# Patient Record
Sex: Female | Born: 2000 | Race: White | Hispanic: No | Marital: Single | Smoking: Never smoker
Health system: Southern US, Community
[De-identification: ages and names within clinical notes are randomized; demographics above are authoritative.]

## PROBLEM LIST (undated history)

## (undated) ENCOUNTER — Ambulatory Visit (HOSPITAL_COMMUNITY): Source: Home / Self Care

## (undated) DIAGNOSIS — R480 Dyslexia and alexia: Secondary | ICD-10-CM

## (undated) DIAGNOSIS — J45909 Unspecified asthma, uncomplicated: Secondary | ICD-10-CM

## (undated) DIAGNOSIS — F909 Attention-deficit hyperactivity disorder, unspecified type: Secondary | ICD-10-CM

## (undated) DIAGNOSIS — F39 Unspecified mood [affective] disorder: Secondary | ICD-10-CM

## (undated) HISTORY — DX: Dyslexia and alexia: R48.0

## (undated) HISTORY — PX: TYMPANOSTOMY TUBE PLACEMENT: SHX32

## (undated) HISTORY — PX: TONSILLECTOMY: SUR1361

## (undated) HISTORY — PX: ADENOIDECTOMY: SUR15

## (undated) HISTORY — PX: OTHER SURGICAL HISTORY: SHX169

## (undated) HISTORY — DX: Unspecified mood (affective) disorder: F39

---
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
## 2018-01-16 DIAGNOSIS — F900 Attention-deficit hyperactivity disorder, predominantly inattentive type: Secondary | ICD-10-CM | POA: Diagnosis not present

## 2018-01-16 DIAGNOSIS — Z79899 Other long term (current) drug therapy: Secondary | ICD-10-CM | POA: Diagnosis not present

## 2018-01-16 DIAGNOSIS — F330 Major depressive disorder, recurrent, mild: Secondary | ICD-10-CM | POA: Diagnosis not present

## 2018-01-16 DIAGNOSIS — Z6832 Body mass index (BMI) 32.0-32.9, adult: Secondary | ICD-10-CM | POA: Diagnosis not present

## 2018-01-16 DIAGNOSIS — F419 Anxiety disorder, unspecified: Secondary | ICD-10-CM | POA: Diagnosis not present

## 2018-02-11 ENCOUNTER — Other Ambulatory Visit: Payer: Self-pay | Admitting: Pediatrics

## 2018-03-06 DIAGNOSIS — F411 Generalized anxiety disorder: Secondary | ICD-10-CM | POA: Diagnosis not present

## 2018-03-06 DIAGNOSIS — F419 Anxiety disorder, unspecified: Secondary | ICD-10-CM | POA: Diagnosis not present

## 2018-03-06 DIAGNOSIS — Z79899 Other long term (current) drug therapy: Secondary | ICD-10-CM | POA: Diagnosis not present

## 2018-03-06 DIAGNOSIS — Z6832 Body mass index (BMI) 32.0-32.9, adult: Secondary | ICD-10-CM | POA: Diagnosis not present

## 2018-03-06 DIAGNOSIS — F900 Attention-deficit hyperactivity disorder, predominantly inattentive type: Secondary | ICD-10-CM | POA: Diagnosis not present

## 2018-03-06 DIAGNOSIS — F330 Major depressive disorder, recurrent, mild: Secondary | ICD-10-CM | POA: Diagnosis not present

## 2018-03-13 DIAGNOSIS — Z6832 Body mass index (BMI) 32.0-32.9, adult: Secondary | ICD-10-CM | POA: Diagnosis not present

## 2018-03-13 DIAGNOSIS — F419 Anxiety disorder, unspecified: Secondary | ICD-10-CM | POA: Diagnosis not present

## 2018-03-13 DIAGNOSIS — F330 Major depressive disorder, recurrent, mild: Secondary | ICD-10-CM | POA: Diagnosis not present

## 2018-03-13 DIAGNOSIS — Z79899 Other long term (current) drug therapy: Secondary | ICD-10-CM | POA: Diagnosis not present

## 2018-03-13 DIAGNOSIS — F900 Attention-deficit hyperactivity disorder, predominantly inattentive type: Secondary | ICD-10-CM | POA: Diagnosis not present

## 2018-04-05 ENCOUNTER — Other Ambulatory Visit: Payer: Self-pay | Admitting: Pediatrics

## 2018-04-05 DIAGNOSIS — J3089 Other allergic rhinitis: Secondary | ICD-10-CM

## 2018-04-06 NOTE — Telephone Encounter (Signed)
COVID pandemic request for allergy med refill  Last seen 08/2017  For allergic rhinitis

## 2018-04-24 DIAGNOSIS — F900 Attention-deficit hyperactivity disorder, predominantly inattentive type: Secondary | ICD-10-CM | POA: Diagnosis not present

## 2018-04-24 DIAGNOSIS — F330 Major depressive disorder, recurrent, mild: Secondary | ICD-10-CM | POA: Diagnosis not present

## 2018-04-24 DIAGNOSIS — F419 Anxiety disorder, unspecified: Secondary | ICD-10-CM | POA: Diagnosis not present

## 2018-04-24 DIAGNOSIS — Z6832 Body mass index (BMI) 32.0-32.9, adult: Secondary | ICD-10-CM | POA: Diagnosis not present

## 2018-06-12 DIAGNOSIS — F330 Major depressive disorder, recurrent, mild: Secondary | ICD-10-CM | POA: Diagnosis not present

## 2018-06-12 DIAGNOSIS — F900 Attention-deficit hyperactivity disorder, predominantly inattentive type: Secondary | ICD-10-CM | POA: Diagnosis not present

## 2018-07-27 ENCOUNTER — Other Ambulatory Visit: Payer: Self-pay | Admitting: Pediatrics

## 2018-07-27 DIAGNOSIS — J3089 Other allergic rhinitis: Secondary | ICD-10-CM

## 2018-09-20 ENCOUNTER — Other Ambulatory Visit: Payer: Self-pay | Admitting: Pediatrics

## 2018-09-20 DIAGNOSIS — J3089 Other allergic rhinitis: Secondary | ICD-10-CM

## 2018-09-25 DIAGNOSIS — M7521 Bicipital tendinitis, right shoulder: Secondary | ICD-10-CM | POA: Diagnosis not present

## 2018-10-14 DIAGNOSIS — H1013 Acute atopic conjunctivitis, bilateral: Secondary | ICD-10-CM | POA: Diagnosis not present

## 2019-01-17 DIAGNOSIS — H1013 Acute atopic conjunctivitis, bilateral: Secondary | ICD-10-CM | POA: Diagnosis not present

## 2019-02-13 DIAGNOSIS — G44209 Tension-type headache, unspecified, not intractable: Secondary | ICD-10-CM | POA: Diagnosis not present

## 2019-02-13 DIAGNOSIS — H40033 Anatomical narrow angle, bilateral: Secondary | ICD-10-CM | POA: Diagnosis not present

## 2019-02-21 DIAGNOSIS — H5213 Myopia, bilateral: Secondary | ICD-10-CM | POA: Diagnosis not present

## 2019-07-01 DIAGNOSIS — H5213 Myopia, bilateral: Secondary | ICD-10-CM | POA: Diagnosis not present

## 2019-07-01 DIAGNOSIS — H1013 Acute atopic conjunctivitis, bilateral: Secondary | ICD-10-CM | POA: Diagnosis not present

## 2019-07-23 DIAGNOSIS — Z79899 Other long term (current) drug therapy: Secondary | ICD-10-CM | POA: Diagnosis not present

## 2019-07-23 DIAGNOSIS — F330 Major depressive disorder, recurrent, mild: Secondary | ICD-10-CM | POA: Diagnosis not present

## 2019-08-06 DIAGNOSIS — F330 Major depressive disorder, recurrent, mild: Secondary | ICD-10-CM | POA: Diagnosis not present

## 2019-08-20 DIAGNOSIS — F330 Major depressive disorder, recurrent, mild: Secondary | ICD-10-CM | POA: Diagnosis not present

## 2019-09-30 DIAGNOSIS — F330 Major depressive disorder, recurrent, mild: Secondary | ICD-10-CM | POA: Diagnosis not present

## 2019-11-12 DIAGNOSIS — F330 Major depressive disorder, recurrent, mild: Secondary | ICD-10-CM | POA: Diagnosis not present

## 2019-11-12 DIAGNOSIS — F062 Psychotic disorder with delusions due to known physiological condition: Secondary | ICD-10-CM | POA: Diagnosis not present

## 2019-12-10 DIAGNOSIS — F330 Major depressive disorder, recurrent, mild: Secondary | ICD-10-CM | POA: Diagnosis not present

## 2019-12-10 DIAGNOSIS — F4310 Post-traumatic stress disorder, unspecified: Secondary | ICD-10-CM | POA: Diagnosis not present

## 2020-01-26 ENCOUNTER — Other Ambulatory Visit: Payer: Self-pay

## 2020-01-26 ENCOUNTER — Ambulatory Visit (HOSPITAL_COMMUNITY)
Admission: EM | Admit: 2020-01-26 | Discharge: 2020-01-26 | Disposition: A | Discharge status: Home / Self Care | Payer: Medicaid Other | Attending: Emergency Medicine | Admitting: Emergency Medicine

## 2020-01-26 ENCOUNTER — Encounter (HOSPITAL_COMMUNITY): Payer: Self-pay

## 2020-01-26 ENCOUNTER — Ambulatory Visit (INDEPENDENT_AMBULATORY_CARE_PROVIDER_SITE_OTHER): Payer: Medicaid Other

## 2020-01-26 DIAGNOSIS — M25571 Pain in right ankle and joints of right foot: Secondary | ICD-10-CM | POA: Diagnosis not present

## 2020-01-26 DIAGNOSIS — S93401A Sprain of unspecified ligament of right ankle, initial encounter: Secondary | ICD-10-CM

## 2020-01-26 DIAGNOSIS — W19XXXA Unspecified fall, initial encounter: Secondary | ICD-10-CM

## 2020-01-26 MED ORDER — IBUPROFEN 600 MG PO TABS: 600.0000 mg | ORAL_TABLET | Freq: Four times a day (QID) | ORAL | 0 refills | Status: DC | PRN

## 2020-01-26 NOTE — ED Triage Notes (Signed)
Pt presents with right ankle pain after a few falls last week outside while walking her dog; pt ambulates well.

## 2020-01-26 NOTE — ED Provider Notes (Signed)
HPI  SUBJECTIVE:  Tiffany [name] is a 20 y.o. female who presents with right ankle pain and "Achilles popping" after stepping in a snow-covered hole twice 1 week ago.  She states that she hyperflexed her ankle the first time and then inverted her ankle the second time.  She reports intermittent numbness and tingling on the top of her foot.  No swelling, bruising, erythema.  She can move her ankle through full range of motion, but states that it hurts.  She was ambulatory immediately after.  She denies ankle weakness.  She denies injury to the knee or foot.  Symptoms are worse with walking, no alleviating factors.  She has not tried anything for this.  Past medical history negative for right ankle injury, diabetes, hypertension.  LMP: 1/1.  Denies possibility being pregnant.  PMD: None.    Past Medical History:  Diagnosis Date  . ADHD (attention deficit hyperactivity disorder) 2004   Took Adderall for several years, then Dexedrine, now Strattera  . Asthma "since infancy"   triggers more in winter, has had bronchitis/pneumonia  . Dyslexia   . Mood disorder (HCC) 2013    Past Surgical History:  Procedure Laterality Date  . ADENOIDECTOMY    . OTHER SURGICAL HISTORY     tubes in the ears  . TONSILLECTOMY    . TYMPANOSTOMY TUBE PLACEMENT      Family History  Problem Relation Age of Onset  . Learning disabilities Mother   . Mental illness Mother   . Drug abuse Mother   . Learning disabilities Brother   . Diabetes Maternal Uncle   . Heart disease Maternal Grandmother   . Hypertension Maternal Grandmother   . Learning disabilities Maternal Grandmother   . Depression Maternal Grandmother   . Anxiety disorder Maternal Grandmother   . Learning disabilities Maternal Grandfather   . Heart murmur Maternal Grandfather   . Alcohol abuse Neg Hx   . Clotting disorder Neg Hx   . Mental illness Father   . Heart disease Father     Social History   Tobacco Use  . Smoking status: Never  Smoker  . Tobacco comment: mom quit!    No current facility-administered medications for this encounter.  Current Outpatient Medications:  .  ibuprofen (ADVIL) 600 MG tablet, Take 1 tablet (600 mg total) by mouth every 6 (six) hours as needed., Disp: 30 tablet, Rfl: 0 .  ADDERALL XR 25 MG 24 hr capsule, Take by mouth daily., Disp: , Rfl: 0 .  albuterol (PROAIR HFA) 108 (90 Base) MCG/ACT inhaler, INHALE 2 PUFFS EVERY 4 HOURS AS NEEDED FOR WHEEZING/SHORTNESS OF BREATH/COUGH (Patient not taking: Reported on 09/01/2017), Disp: 1 Inhaler, Rfl: 0 .  cetirizine (ZYRTEC) 10 MG tablet, TAKE 1 TABLET(10 MG) BY MOUTH DAILY, Disp: 30 tablet, Rfl: 5 .  escitalopram (LEXAPRO) 10 MG tablet, Take 10 mg by mouth daily. Reported on 02/26/2015, Disp: , Rfl:  .  fluticasone (FLONASE) 50 MCG/ACT nasal spray, Place 2 sprays into both nostrils daily., Disp: 16 g, Rfl: 5 .  montelukast (SINGULAIR) 5 MG chewable tablet, Chew 1 tablet (5 mg total) by mouth every evening., Disp: 30 tablet, Rfl: 11 .  OXcarbazepine (TRILEPTAL) 150 MG tablet, Take 75 mg by mouth 2 (two) times daily., Disp: , Rfl:  .  traZODone (DESYREL) 100 MG tablet, Take 100 mg by mouth at bedtime., Disp: , Rfl: 1 .  valACYclovir (VALTREX) 1000 MG tablet, Take 2 tablets (2,000 mg total) by mouth 2 (two) times  daily. (Patient not taking: Reported on 09/01/2017), Disp: 4 tablet, Rfl: 1 .  XULANE 150-35 MCG/24HR transdermal patch, PLACE 1 PATCH ONTO THE SKIN ONCE A WEEK. USE FOR 3 WEEKS THEN LEAVE OFF FOR 1 WEEK THEN RESTART, Disp: 3 patch, Rfl: 0 .  XULANE 150-35 MCG/24HR transdermal patch, PLACE 1 PATCH ONTO THE SKIN ONCE A WEEK. USE FOR 3 WEEKS THEN LEAVE OFF FOR 1 WEEK THEN RESTART, Disp: 3 patch, Rfl: 4 .  XULANE 150-35 MCG/24HR transdermal patch, PLACE 1 PATCH ONTO THE SKIN ONCE A WEEK. USE FOR 3 WEEKS THEN LEAVE OFF FOR 1 WEEK THEN RESTART, Disp: 3 patch, Rfl: 0 .  XULANE 150-35 MCG/24HR transdermal patch, PLACE 1 PATCH ONTO THE SKIN ONCE A WEEK FOR THREE  WEEKS, THEN LEAVE OFF FOR 1 WEEK THEN RESTART, Disp: 3 patch, Rfl: 11  Allergies  Allergen Reactions  . Augmentin [Amoxicillin-Pot Clavulanate] Hives and Other (See Comments)          ROS  As noted in HPI.   Physical Exam  BP 111/61 (BP Location: Right Arm)   Pulse 82   Temp 99.1 F (37.3 C) (Oral)   Resp 18   LMP 01/04/2020   SpO2 100%   Constitutional: Well developed, well nourished, no acute distress Eyes:  EOMI, conjunctiva normal bilaterally HENT: Normocephalic, atraumatic,mucus membranes moist Respiratory: Normal inspiratory effort Cardiovascular: Normal rate GI: nondistended skin: No rash, skin intact Musculoskeletal:  R  Ankle Proximal fibula NT, Distal fibula NT , Medial malleolus tender,  Deltoid ligaments medially tender,  ATFL NT , calcaneofibular ligament NT, posterior tablofibular ligament tender,  Achilles tender, no soft tissue defects, calcaneus NT,  Proximal 5th metatarsal mildly tender but no ecchymosis, swelling in this area. Midfoot NT, distal NVI with baseline sensation / motor to foot with DP 2+. Pain  with dorsiflexion/plantar flexion. Pain with inversion/eversion. - bruising. - squeeze test .  Ant drawer test stable. Pt able to bear weight in dept.  Neurologic: Alert & oriented x 3, no focal neuro deficits Psychiatric: Speech and behavior appropriate   ED Course   Medications - No data to display  Orders Placed This Encounter  Procedures  . DG Ankle Complete Right    Standing Status:   Standing    Number of Occurrences:   1    Order Specific Question:   Reason for Exam (SYMPTOM  OR DIAGNOSIS REQUIRED)    Answer:   fall r/o fx  . Apply ASO ankle    Standing Status:   Standing    Number of Occurrences:   1    Order Specific Question:   Laterality    Answer:   Right    No results found for this or any previous visit (from the past 24 hour(s)). DG Ankle Complete Right  Result Date: 01/26/2020 CLINICAL DATA:  Right ankle pain after multiple  falls. EXAM: RIGHT ANKLE - COMPLETE 3+ VIEW COMPARISON:  None. FINDINGS: No acute fracture or dislocation. Base of fifth metatarsal and talar dome intact. IMPRESSION: No acute osseous abnormality. Electronically Signed   By: Jeronimo Greaves M.D.   On: 01/26/2020 18:29    ED Clinical Impression  1. Sprain of right ankle, unspecified ligament, initial encounter      ED Assessment/Plan  Reviewed imaging independently.  No fracture, no dislocation.  Base of fifth metatarsal intact.  See radiology report for full details.  Patient with sprain of multiple ligaments right ankle.  There does not appear to be any evidence of  an Achilles tendon tear.  Films negative for fracture of the ankle or fifth metatarsal. will place in an ASO, Tylenol/ibuprofen, ice, refer to [facility] sports medicine for further evaluation and possible physical therapy.  Discussed imaging, MDM, treatment plan, and plan for follow-up with patient. . patient agrees with plan.   Meds ordered this encounter  Medications  . ibuprofen (ADVIL) 600 MG tablet    Sig: Take 1 tablet (600 mg total) by mouth every 6 (six) hours as needed.    Dispense:  30 tablet    Refill:  0    *This clinic note was created using Scientist, clinical (histocompatibility and immunogenetics). Therefore, there may be occasional mistakes despite careful proofreading.   ?    Domenick Gong, MD 01/27/20 [phone]

## 2020-01-26 NOTE — Discharge Instructions (Addendum)
Wear the ASO for the next several weeks as needed for comfort.  Ice for 15 minutes at a time, elevate above your heart is much as possible.  Take 600 mg of ibuprofen combined with 1000 mg of Tylenol 3-4 times a day as needed for pain.  Follow-up with [facility] sports [facility] as soon as she can.

## 2020-03-03 DIAGNOSIS — F330 Major depressive disorder, recurrent, mild: Secondary | ICD-10-CM | POA: Diagnosis not present

## 2020-06-02 DIAGNOSIS — F330 Major depressive disorder, recurrent, mild: Secondary | ICD-10-CM | POA: Diagnosis not present

## 2020-08-16 ENCOUNTER — Encounter (HOSPITAL_COMMUNITY): Payer: Self-pay

## 2020-08-16 ENCOUNTER — Ambulatory Visit (HOSPITAL_COMMUNITY)
Admission: EM | Admit: 2020-08-16 | Discharge: 2020-08-16 | Disposition: A | Discharge status: Home / Self Care | Payer: Medicaid Other | Attending: Student | Admitting: Student

## 2020-08-16 DIAGNOSIS — L719 Rosacea, unspecified: Secondary | ICD-10-CM | POA: Diagnosis not present

## 2020-08-16 MED ORDER — METRONIDAZOLE 1 % EX GEL: Freq: Every day | CUTANEOUS | 1 refills | Status: DC

## 2020-08-16 NOTE — ED Provider Notes (Signed)
[facility]    CSN: [id number] Arrival date & time: 08/16/20  1154      History   Chief Complaint Chief Complaint  Patient presents with   Rash    HPI Tiffany [name] is a 20 y.o. female presenting with facial rash for about 1 month.  Medical history asthma, ADHD.  States that the rash has been present for the entire month, but with some exacerbations and improvements.  The rash is sometimes on her eyelids, but not currently. Describes as occasionally itchy. Has tried tea tree oil, other OTC products without improvement. Denies changes in routine or new products/fragrances. Unable to identify triggers for making rash worse.   HPI  Past Medical History:  Diagnosis Date   ADHD (attention deficit hyperactivity disorder) 2004   Took Adderall for several years, then Dexedrine, now Strattera   Asthma "since infancy"   triggers more in winter, has had bronchitis/pneumonia   Dyslexia    Mood disorder (HCC) 2013    Patient Active Problem List   Diagnosis Date Noted   Bilateral hip pain 04/20/2015   Adjustment disorder with mixed anxiety and depressed mood 04/14/2014   History of self-harm 04/14/2014   Dry eyes 01/08/2014   Dyslexia 12/06/2013   Dysmenorrhea in adolescent 11/12/2013   Surveillance of previously prescribed contraceptive method 11/12/2013   ADHD (attention deficit hyperactivity disorder) 09/04/2013   Asthma, chronic 09/04/2013    Past Surgical History:  Procedure Laterality Date   ADENOIDECTOMY     OTHER SURGICAL HISTORY     tubes in the ears   TONSILLECTOMY     TYMPANOSTOMY TUBE PLACEMENT      OB History   No obstetric history on file.      Home Medications    Prior to Admission medications   Medication Sig Start Date End Date Taking? Authorizing Provider  metroNIDAZOLE (METROGEL) 1 % gel Apply topically daily. Apply to cheeks 2x daily while symptoms persist 08/16/20  Yes Rhys Martini, PA-C  ADDERALL XR 25 MG 24 hr capsule Take by  mouth daily. 02/13/15   [provider]  albuterol (PROAIR HFA) 108 (90 Base) MCG/ACT inhaler INHALE 2 PUFFS EVERY 4 HOURS AS NEEDED FOR WHEEZING/SHORTNESS OF BREATH/COUGH Patient not taking: Reported on 09/01/2017 02/26/15   Gardenia Phlegm, MD  cetirizine (ZYRTEC) 10 MG tablet TAKE 1 TABLET(10 MG) BY MOUTH DAILY 04/05/18   Prose, [place] Bing, MD  escitalopram (LEXAPRO) 10 MG tablet Take 10 mg by mouth daily. Reported on 02/26/2015 02/13/14   [provider]  fluticasone (FLONASE) 50 MCG/ACT nasal spray Place 2 sprays into both nostrils daily. 09/01/17   Theadore Nan, MD  ibuprofen (ADVIL) 600 MG tablet Take 1 tablet (600 mg total) by mouth every 6 (six) hours as needed. 01/26/20   Domenick Gong, MD  montelukast (SINGULAIR) 5 MG chewable tablet Chew 1 tablet (5 mg total) by mouth every evening. 09/01/17   Theadore Nan, MD  OXcarbazepine (TRILEPTAL) 150 MG tablet Take 75 mg by mouth 2 (two) times daily.    [provider]  traZODone (DESYREL) 100 MG tablet Take 100 mg by mouth at bedtime. 03/12/15   [provider]  valACYclovir (VALTREX) 1000 MG tablet Take 2 tablets (2,000 mg total) by mouth 2 (two) times daily. Patient not taking: Reported on 09/01/2017 04/02/15   Clint Guy, MD  [employer] 150-35 MCG/24HR transdermal patch PLACE 1 PATCH ONTO THE SKIN ONCE A WEEK. USE FOR 3 WEEKS THEN LEAVE OFF FOR  1 WEEK THEN RESTART 04/12/15   Owens Shark, MD  [employer] 150-35 MCG/24HR transdermal patch PLACE 1 PATCH ONTO THE SKIN ONCE A WEEK. USE FOR 3 WEEKS THEN LEAVE OFF FOR 1 WEEK THEN RESTART 01/29/16   Verneda Skill, FNP  [employer] 150-35 MCG/24HR transdermal patch PLACE 1 PATCH ONTO THE SKIN ONCE A WEEK. USE FOR 3 WEEKS THEN LEAVE OFF FOR 1 WEEK THEN RESTART 07/26/16   Georges Mouse, NP  [employer] 150-35 MCG/24HR transdermal patch PLACE 1 PATCH ONTO THE SKIN ONCE A WEEK FOR THREE WEEKS, THEN LEAVE OFF FOR 1 WEEK THEN RESTART 02/12/18   Verneda Skill, FNP     Family History Family History  Problem Relation Age of Onset   Learning disabilities Mother    Mental illness Mother    Drug abuse Mother    Learning disabilities Brother    Diabetes Maternal Uncle    Heart disease Maternal Grandmother    Hypertension Maternal Grandmother    Learning disabilities Maternal Grandmother    Depression Maternal Grandmother    Anxiety disorder Maternal Grandmother    Learning disabilities Maternal Grandfather    Heart murmur Maternal Grandfather    Alcohol abuse Neg Hx    Clotting disorder Neg Hx    Mental illness Father    Heart disease Father     Social History Social History   Tobacco Use   Smoking status: Never   Smokeless tobacco: Never   Tobacco comments:    mom quit!     Allergies   Augmentin [amoxicillin-pot clavulanate]   Review of Systems Review of Systems  Skin:  Positive for rash.  All other systems reviewed and are negative.   Physical Exam Triage Vital Signs ED Triage Vitals  Enc Vitals Group     BP 08/16/20 1324 129/75     Pulse Rate 08/16/20 1324 66     Resp 08/16/20 1324 18     Temp 08/16/20 1324 99 F (37.2 C)     Temp Source 08/16/20 1324 Oral     SpO2 08/16/20 1324 100 %     Weight --      Height --      Head Circumference --      Peak Flow --      Pain Score 08/16/20 1323 0     Pain Loc --      Pain Edu? --      Excl. in GC? --    No data found.  Updated Vital Signs BP 129/75 (BP Location: Right Arm)   Pulse 66   Temp 99 F (37.2 C) (Oral)   Resp 18   LMP 07/23/2020 (Exact Date)   SpO2 100%   Visual Acuity Right Eye Distance:   Left Eye Distance:   Bilateral Distance:    Right Eye Near:   Left Eye Near:    Bilateral Near:     Physical Exam Vitals reviewed.  Constitutional:      General: She is not in acute distress.    Appearance: Normal appearance. She is not ill-appearing or diaphoretic.  HENT:     Head: Normocephalic and atraumatic.  Cardiovascular:     Rate and Rhythm:  Normal rate and regular rhythm.     Heart sounds: Normal heart sounds.  Pulmonary:     Effort: Pulmonary effort is normal.     Breath sounds: Normal breath sounds.  Skin:    General: Skin is warm.     Comments: Bilateral cheeks  with erythematous rash with few papules and pustules. Nontender. No rash currently on eyelids. No facial/pharyngeal swelling.  Neurological:     General: No focal deficit present.     Mental Status: She is alert and oriented to person, place, and time.  Psychiatric:        Mood and Affect: Mood normal.        Behavior: Behavior normal.        Thought Content: Thought content normal.        Judgment: Judgment normal.     UC Treatments / Results  Labs (all labs ordered are listed, but only abnormal results are displayed) Labs Reviewed - No data to display  EKG   Radiology No results found.  Procedures Procedures (including critical care time)  Medications Ordered in UC Medications - No data to display  Initial Impression / Assessment and Plan / UC Course  I have reviewed the triage vital signs and the nursing notes.  Pertinent labs & imaging results that were available during my care of the patient were reviewed by me and considered in my medical decision making (see chart for details).     This patient is a very pleasant 20 y.o. year old female presenting with rosacea. Trial of metrogel as below. ED return precautions discussed. Patient verbalizes understanding and agreement.     Final Clinical Impressions(s) / UC Diagnoses   Final diagnoses:  Rosacea     Discharge Instructions      -Your rash appears to be Rosacea: Rosacea causes blood vessels near the surface of the skin to get bigger (be enlarged), and that makes the skin red. -Try the metronidazole gel, twice daily while symptoms persist.  Typically for about 1 to 3 weeks. -If symptoms do not improve with this prescription, try over-the-counter hydrocortisone ointment. -Seek  additional medical attention if symptoms are getting worse instead of better, or new symptoms like fever/chills. -Wash the face with gentle soap and water only.   ED Prescriptions     Medication Sig Dispense Auth. Provider   metroNIDAZOLE (METROGEL) 1 % gel Apply topically daily. Apply to cheeks 2x daily while symptoms persist 45 g Rhys Martini, PA-C      PDMP not reviewed this encounter.   Rhys Martini, PA-C 08/16/20 1353

## 2020-08-16 NOTE — ED Triage Notes (Signed)
Pt presents with a rash on her face. States it started near her eyebrow and moved around to her eyes.

## 2020-08-16 NOTE — Discharge Instructions (Addendum)
-  Your rash appears to be Rosacea: Rosacea causes blood vessels near the surface of the skin to get bigger (be enlarged), and that makes the skin red. -Try the metronidazole gel, twice daily while symptoms persist.  Typically for about 1 to 3 weeks. -If symptoms do not improve with this prescription, try over-the-counter hydrocortisone ointment. -Seek additional medical attention if symptoms are getting worse instead of better, or new symptoms like fever/chills. -Wash the face with gentle soap and water only.

## 2021-02-02 ENCOUNTER — Other Ambulatory Visit: Payer: Self-pay

## 2021-02-02 ENCOUNTER — Ambulatory Visit (INDEPENDENT_AMBULATORY_CARE_PROVIDER_SITE_OTHER): Payer: Medicaid Other | Admitting: Psychiatry

## 2021-02-02 ENCOUNTER — Encounter (HOSPITAL_COMMUNITY): Payer: Self-pay | Admitting: Psychiatry

## 2021-02-02 VITALS — BP 132/75 | HR 84 | Ht 61.0 in | Wt 200.0 lb

## 2021-02-02 DIAGNOSIS — F331 Major depressive disorder, recurrent, moderate: Secondary | ICD-10-CM | POA: Diagnosis not present

## 2021-02-02 MED ORDER — SERTRALINE HCL 50 MG PO TABS: 50.0000 mg | ORAL_TABLET | Freq: Every day | ORAL | 0 refills | Status: DC
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
## 2021-02-04 ENCOUNTER — Telehealth (HOSPITAL_COMMUNITY): Payer: Self-pay | Admitting: Psychiatry

## 2021-02-04 NOTE — Telephone Encounter (Signed)
Patients medications were mistakenly sent to [facility] on [address]. Patient is requesting to have medications resent to [facility] on [address].

## 2021-02-05 ENCOUNTER — Other Ambulatory Visit (HOSPITAL_COMMUNITY): Payer: Self-pay | Admitting: Psychiatry

## 2021-02-05 ENCOUNTER — Other Ambulatory Visit (HOSPITAL_COMMUNITY): Payer: Self-pay | Admitting: *Deleted

## 2021-02-05 MED ORDER — SERTRALINE HCL 50 MG PO TABS: 50.0000 mg | ORAL_TABLET | Freq: Every day | ORAL | 0 refills | Status: DC

## 2021-02-05 NOTE — Progress Notes (Signed)
Orders only: Zoloft e-scribed to [facility] per patient request.

## 2021-02-26 ENCOUNTER — Encounter: Payer: Self-pay | Admitting: Pediatrics

## 2021-03-02 ENCOUNTER — Ambulatory Visit (INDEPENDENT_AMBULATORY_CARE_PROVIDER_SITE_OTHER): Payer: Medicaid Other | Admitting: Psychiatry

## 2021-03-02 ENCOUNTER — Other Ambulatory Visit: Payer: Self-pay

## 2021-03-02 ENCOUNTER — Encounter (HOSPITAL_COMMUNITY): Payer: Self-pay | Admitting: Psychiatry

## 2021-03-02 VITALS — BP 127/65 | HR 66 | Ht 61.0 in | Wt 197.0 lb

## 2021-03-02 DIAGNOSIS — F331 Major depressive disorder, recurrent, moderate: Secondary | ICD-10-CM

## 2021-03-02 MED ORDER — SERTRALINE HCL 100 MG PO TABS: 100.0000 mg | ORAL_TABLET | Freq: Every day | ORAL | 1 refills | Status: DC

## 2021-03-02 NOTE — Progress Notes (Signed)
BH MD/PA/NP OP Progress Note  03/02/2021 11:41 AM Tiffany [name]  MRN:  [id number]  Chief Complaint: Medication Management  HPI: Tiffany [name] is a 21 year old female presenting  [facility] for a follow up psychiatric evaluation for medication management, accompanied by her mother. She reports that her Zoloft was initially effective but feels as though her depressive symptoms are returning. Patient reports wanting to stay in bed for extended periods of time. She is open to a medication adjustment to better management of depressive symptoms.  Patient is alert and oriented x4, pleasant, and willing to engage.  She is dressed appropriately for the weather and well-groomed.  She denies suicidal homicidal ideations today.  She denies paranoia, delusions, or auditory or visual hallucinations.   Visit Diagnosis:    ICD-10-CM   1. MDD (major depressive disorder), recurrent episode, moderate ([place])  F33.1       Past Psychiatric History: MDD  Past Medical History:  Past Medical History:  Diagnosis Date   ADHD (attention deficit hyperactivity disorder) 2004   Took Adderall for several years, then Dexedrine, now Strattera   Asthma "since infancy"   triggers more in winter, has had bronchitis/pneumonia   Dyslexia    Mood disorder ([place]) 2013    Past Surgical History:  Procedure Laterality Date   ADENOIDECTOMY     OTHER SURGICAL HISTORY     tubes in the ears   TONSILLECTOMY     TYMPANOSTOMY TUBE PLACEMENT      Family Psychiatric History: see below  Family History:  Family History  Problem Relation Age of Onset   Learning disabilities Mother    Mental illness Mother    Drug abuse Mother    Learning disabilities Brother    Diabetes Maternal Uncle    Heart disease Maternal Grandmother    Hypertension Maternal Grandmother    Learning disabilities Maternal Grandmother    Depression Maternal Grandmother    Anxiety disorder Maternal Grandmother    Learning  disabilities Maternal Grandfather    Heart murmur Maternal Grandfather    Alcohol abuse Neg Hx    Clotting disorder Neg Hx    Mental illness Father    Heart disease Father     Social History:  Social History   Socioeconomic History   Marital status: Single    Spouse name: Not on file   Number of children: Not on file   Years of education: Not on file   Highest education level: Not on file  Occupational History   Not on file  Tobacco Use   Smoking status: Never   Smokeless tobacco: Never   Tobacco comments:    mom quit!  Substance and Sexual Activity   Alcohol use: Not on file   Drug use: Not on file   Sexual activity: Not on file  Other Topics Concern   Not on file  Social History Narrative   Not on file   Social Determinants of Health   Financial Resource Strain: Not on file  Food Insecurity: Not on file  Transportation Needs: Not on file  Physical Activity: Not on file  Stress: Not on file  Social Connections: Not on file    Allergies:  Allergies  Allergen Reactions   Augmentin [Amoxicillin-Pot Clavulanate] Hives and Other (See Comments)        Bee Pollen    Latex    Penicillins Rash    Metabolic Disorder Labs: No results found for: HGBA1C, MPG No results found for: PROLACTIN  No results found for: CHOL, TRIG, HDL, CHOLHDL, VLDL, LDLCALC No results found for: TSH  Therapeutic Level Labs: No results found for: LITHIUM No results found for: VALPROATE No components found for:  CBMZ  Current Medications: Current Outpatient Medications  Medication Sig Dispense Refill   metroNIDAZOLE (METROGEL) 1 % gel Apply topically daily. Apply to cheeks 2x daily while symptoms persist 45 g 1   sertraline (ZOLOFT) 100 MG tablet Take 1 tablet (100 mg total) by mouth daily. 30 tablet 1   No current facility-administered medications for this visit.     Musculoskeletal: Strength & Muscle Tone: within normal limits Gait & Station: normal Patient leans:  N/A  Psychiatric Specialty Exam: Review of Systems  Psychiatric/Behavioral:  Positive for dysphoric mood. Negative for hallucinations, self-injury and suicidal ideas.   All other systems reviewed and are negative.  Blood pressure 127/65, pulse 66, height 5\' 1"  (1.549 m), weight 197 lb (89.4 kg).Body mass index is 37.22 kg/m.  General Appearance: Well Groomed  Eye Contact:  Good  Speech:  Clear and Coherent  Volume:  Normal  Mood:  Depressed  Affect:  Congruent  Thought Process:  Coherent  Orientation:  Full (Time, Place, and Person)  Thought Content: Logical   Suicidal Thoughts:  No  Homicidal Thoughts:  No  Memory:  Immediate;   Good Recent;   Good Remote;   Good  Judgement:  Good  Insight:  Good  Psychomotor Activity:  Negative  Concentration:  Concentration: Good and Attention Span: Good  Recall:  Good  Fund of Knowledge: Good  Language: Good  Akathisia:  Negative  Handed:  Right  AIMS (if indicated): not done  Assets:  Communication Skills Desire for Improvement Housing Social Support  ADL's:  Intact  Cognition: WNL  Sleep:  Fair   Screenings: GAD-7    [place] Office Visit from 02/02/2021 in [facility]  Total GAD-7 Score 7      PHQ2-9    [place] Office Visit from 02/02/2021 in [place]  PHQ-2 Total Score 3  PHQ-9 Total Score 12      [place] Office Visit from 02/02/2021 in [facility] ED from 08/16/2020 in [name] ED from 01/26/2020 in [facility] [place] No Risk No Risk No Risk        Assessment and Plan: Tiffany [name] is a 21 year old female presenting  [facility] for a follow up psychiatric evaluation for medication management. She reports that her Zoloft was initially effective but feels as though her depressive symptoms are returning. Patient reports  wanting to stay in bed for extended periods of time. She is open to a medication adjustment to better management of depressive symptoms. Medication benefits vs. Risks discussed.  Collaboration of Care: Collaboration of Care: Medication Management AEB Medications e-scribed to patient's preferred pharmacy.  1. MDD (major depressive disorder), recurrent episode, moderate (HCC)  Increased - sertraline (ZOLOFT) 100 MG tablet; Take 1 tablet (100 mg total) by mouth daily.  Dispense: 30 tablet; Refill: 1   Return to care in 6 weeks    Patient/Guardian was advised Release of Information must be obtained prior to any record release in order to collaborate their care with an outside provider. Patient/Guardian was advised if they have not already done so to contact the registration department to sign all necessary forms in order for [place] to release  information regarding their care.   Consent: Patient/Guardian gives verbal consent for treatment and assignment of benefits for services provided during this visit. Patient/Guardian expressed understanding and agreed to proceed.    Franne Grip, NP 03/02/2021, 11:41 AM

## 2021-03-04 ENCOUNTER — Ambulatory Visit (HOSPITAL_COMMUNITY): Payer: Medicaid Other | Admitting: Psychiatry

## 2021-03-25 ENCOUNTER — Telehealth (HOSPITAL_COMMUNITY): Payer: Self-pay | Admitting: *Deleted

## 2021-03-25 NOTE — Telephone Encounter (Signed)
Pharmacy faxed request on patients behalf for a 90 day supply of Sertraline 100 mg, She has an available refill till her next appt on 04/14/21. Will notify Ce Ce NP of the request to fill her med for a 90 day supply. ?

## 2021-04-09 ENCOUNTER — Ambulatory Visit (INDEPENDENT_AMBULATORY_CARE_PROVIDER_SITE_OTHER): Payer: Medicaid Other | Admitting: Clinical

## 2021-04-09 DIAGNOSIS — F902 Attention-deficit hyperactivity disorder, combined type: Secondary | ICD-10-CM

## 2021-04-09 DIAGNOSIS — F331 Major depressive disorder, recurrent, moderate: Secondary | ICD-10-CM

## 2021-04-09 DIAGNOSIS — F4310 Post-traumatic stress disorder, unspecified: Secondary | ICD-10-CM

## 2021-04-11 ENCOUNTER — Encounter (HOSPITAL_COMMUNITY): Payer: Self-pay

## 2021-04-11 NOTE — Plan of Care (Signed)
Client is in agreement with the treatment plan. °
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
## 2021-04-14 ENCOUNTER — Telehealth (INDEPENDENT_AMBULATORY_CARE_PROVIDER_SITE_OTHER): Payer: Medicaid Other | Admitting: Psychiatry

## 2021-04-14 ENCOUNTER — Encounter (HOSPITAL_COMMUNITY): Payer: Self-pay | Admitting: Psychiatry

## 2021-04-14 DIAGNOSIS — F331 Major depressive disorder, recurrent, moderate: Secondary | ICD-10-CM | POA: Diagnosis not present

## 2021-04-14 MED ORDER — SERTRALINE HCL 100 MG PO TABS: 100.0000 mg | ORAL_TABLET | Freq: Every day | ORAL | 2 refills | Status: DC

## 2021-04-14 NOTE — Progress Notes (Signed)
BH MD/PA/NP OP Progress Note ? ?04/14/2021 6:23 PM ?Tiffany [name]  ?MRN:  [id number] ? ?Virtual Visit via Telephone Note ? ?I connected with Tiffany [name] on 04/14/21 at 11:00 AM EDT by telephone and verified that I am speaking with the correct person using two identifiers. ? ?Location: ?Patient: home ?Provider: Offsite ?  ?I discussed the limitations, risks, security and privacy concerns of performing an evaluation and management service by telephone and the availability of in person appointments. I also discussed with the patient that there may be a patient responsible charge related to this service. The patient expressed understanding and agreed to proceed. ? ? ?  ?I discussed the assessment and treatment plan with the patient. The patient was provided an opportunity to ask questions and all were answered. The patient agreed with the plan and demonstrated an understanding of the instructions. ?  ?The patient was advised to call back or seek an in-person evaluation if the symptoms worsen or if the condition fails to improve as anticipated. ? ?I provided 10 minutes of non-face-to-face time during this encounter. ? ? ?Franne Grip, NP  ? ?Chief Complaint: Medication management ? ?HPI: Tiffany [name] is a 21 year old female presenting to [facility] behavioral health outpatient for follow-up psychiatric evaluation.  She has a psychiatric history of ADHD and major depressive disorder.  Her symptoms are managed with Zoloft 100 mg daily.  Patient reports that her symptoms are managed effectively with her current medication regimen and that she is medication compliant.  Patient denies adverse medication effects other than the need for dosage adjustment today.  No medication changes. ? ?Visit Diagnosis:  ?  ICD-10-CM   ?1. MDD (major depressive disorder), recurrent episode, moderate (HCC)  F33.1   ?  ? ? ?Past Psychiatric History:  ? ?Past Medical History:  ?Past Medical History:  ?Diagnosis Date  ? ADHD (attention  deficit hyperactivity disorder) 2004  ? Took Adderall for several years, then Dexedrine, now Strattera  ? Asthma "since infancy"  ? triggers more in winter, has had bronchitis/pneumonia  ? Dyslexia   ? Mood disorder ([place]) 2013  ?  ?Past Surgical History:  ?Procedure Laterality Date  ? ADENOIDECTOMY    ? OTHER SURGICAL HISTORY    ? tubes in the ears  ? TONSILLECTOMY    ? TYMPANOSTOMY TUBE PLACEMENT    ? ? ?Family Psychiatric History:  ? ?Family History:  ?Family History  ?Problem Relation Age of Onset  ? Learning disabilities Mother   ? Mental illness Mother   ? Drug abuse Mother   ? Learning disabilities Brother   ? Diabetes Maternal Uncle   ? Heart disease Maternal Grandmother   ? Hypertension Maternal Grandmother   ? Learning disabilities Maternal Grandmother   ? Depression Maternal Grandmother   ? Anxiety disorder Maternal Grandmother   ? Learning disabilities Maternal Grandfather   ? Heart murmur Maternal Grandfather   ? Alcohol abuse Neg Hx   ? Clotting disorder Neg Hx   ? Mental illness Father   ? Heart disease Father   ? ? ?Social History:  ?Social History  ? ?Socioeconomic History  ? Marital status: Single  ?  Spouse name: Not on file  ? Number of children: Not on file  ? Years of education: Not on file  ? Highest education level: Not on file  ?Occupational History  ? Not on file  ?Tobacco Use  ? Smoking status: Never  ? Smokeless tobacco: Never  ? Tobacco comments:  ?  mom quit!  ?Substance and Sexual Activity  ? Alcohol use: Not on file  ? Drug use: Not on file  ? Sexual activity: Not on file  ?Other Topics Concern  ? Not on file  ?Social History Narrative  ? Not on file  ? ?Social Determinants of Health  ? ?Financial Resource Strain: Not on file  ?Food Insecurity: Not on file  ?Transportation Needs: Not on file  ?Physical Activity: Not on file  ?Stress: Not on file  ?Social Connections: Not on file  ? ? ?Allergies:  ?Allergies  ?Allergen Reactions  ? Augmentin [Amoxicillin-Pot Clavulanate] Hives and Other  (See Comments)  ?     ? Bee Pollen   ? Latex   ? Penicillins Rash  ? ? ?Metabolic Disorder Labs: ?No results found for: HGBA1C, MPG ?No results found for: PROLACTIN ?No results found for: CHOL, TRIG, HDL, CHOLHDL, VLDL, LDLCALC ?No results found for: TSH ? ?Therapeutic Level Labs: ?No results found for: LITHIUM ?No results found for: VALPROATE ?No components found for:  CBMZ ? ?Current Medications: ?Current Outpatient Medications  ?Medication Sig Dispense Refill  ? metroNIDAZOLE (METROGEL) 1 % gel Apply topically daily. Apply to cheeks 2x daily while symptoms persist 45 g 1  ? sertraline (ZOLOFT) 100 MG tablet Take 1 tablet (100 mg total) by mouth daily. 30 tablet 2  ? ?No current facility-administered medications for this visit.  ? ? ? ?Musculoskeletal: ?Strength & Muscle Tone: N/A virtual visit ?Gait & Station: N/A virtual visit ?Patient leans: N/A ? ?Psychiatric Specialty Exam: ?Review of Systems  ?Psychiatric/Behavioral:  Negative for hallucinations, self-injury and suicidal ideas.   ?All other systems reviewed and are negative.  ?There were no vitals taken for this visit.There is no height or weight on file to calculate BMI.  ?General Appearance: NA  ?Eye Contact:  NA  ?Speech:  Clear and Coherent  ?Volume:  Normal  ?Mood:  Euthymic  ?Affect:  NA  ?Thought Process:  Coherent  ?Orientation:  Full (Time, Place, and Person)  ?Thought Content: Logical   ?Suicidal Thoughts:  No  ?Homicidal Thoughts:  No  ?Memory: Good  ?Judgement:  Good  ?Insight:  Good  ?Psychomotor Activity:  NA  ?Concentration: Good  ?Recall:  Good  ?Fund of Knowledge: Good  ?Language: Good  ?Akathisia:  NA  ?Handed:  Right  ?AIMS (if indicated): not done  ?Assets:  Communication Skills ?Desire for Improvement  ?ADL's:  Intact  ?Cognition: WNL  ?Sleep:  Good  ? ?Screenings: ?GAD-7   ? ?Flowsheet Row Counselor from 04/09/2021 in [facility] Office Visit from 02/02/2021 in [facility]  ?Total  GAD-7 Score 12 7  ? ?  ? ?PHQ2-9   ? ?Flowsheet Row Counselor from 04/09/2021 in [facility] Office Visit from 02/02/2021 in [facility]  ?PHQ-2 Total Score 4 3  ?PHQ-9 Total Score 9 12  ? ?  ? ?Flowsheet Row Counselor from 04/09/2021 in [facility] Office Visit from 02/02/2021 in [facility] ED from 08/16/2020 in [facility]  ?C-SSRS RISK CATEGORY No Risk No Risk No Risk  ? ?  ? ? ? ?Assessment and Plan: Tiffany [name] is a 21 year old female presenting to [facility] behavioral health outpatient for follow-up psychiatric evaluation.  She has a psychiatric history of ADHD and major depressive disorder.  Her symptoms are managed with Zoloft 100 mg daily.  Patient reports that  her symptoms are managed effectively with her current medication regimen and that she is medication compliant.  Patient denies adverse medication effects other than the need for dosage adjustment today.  No medication changes. ? ? ? ?Collaboration of Care: Collaboration of Care: Medication Management AEB Zoloft refilled at current dosage and E scribed to patient's preferred pharmacy. ?1. MDD (major depressive disorder), recurrent episode, moderate ([place]) ? ? ?Other orders ?- sertraline (ZOLOFT) 100 MG tablet; Take 1 tablet (100 mg total) by mouth daily.  Dispense: 30 tablet; Refill: 2  ? ? ?Patient to return to care in 3 months. ? ?Patient/Guardian was advised Release of Information must be obtained prior to any record release in order to collaborate their care with an outside provider. Patient/Guardian was advised if they have not already done so to contact the registration department to sign all necessary forms in order for [place] to release information regarding their care.  ? ?Consent: Patient/Guardian gives verbal consent for treatment and assignment of benefits for services provided during this visit.  Patient/Guardian expressed understanding and agreed to proceed.  ? ? ?Franne Grip, NP ?04/14/2021, 6:23 PM ? ?

## 2021-04-28 ENCOUNTER — Encounter: Payer: Self-pay | Admitting: Pediatrics

## 2021-04-29 ENCOUNTER — Ambulatory Visit (INDEPENDENT_AMBULATORY_CARE_PROVIDER_SITE_OTHER): Payer: Medicaid Other | Admitting: Clinical

## 2021-04-29 DIAGNOSIS — F331 Major depressive disorder, recurrent, moderate: Secondary | ICD-10-CM
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
## 2021-04-30 NOTE — Plan of Care (Signed)
?  Problem: Depression CCP Problem  1 coping skills Goal: LTG: Matt[place] WILL SCORE LESS THAN 10 ON THE PATIENT HEALTH QUESTIONNAIRE (PHQ-9) Outcome: Progressing Goal: STG: [place] WILL COMPLETE AT LEAST 80% OF ASSIGNED HOMEWORK Outcome: Progressing Goal: STG: [place] WILL IDENTIFY 3 COGNITIVE PATTERNS AND BELIEFS THAT SUPPORT DEPRESSION Outcome: Progressing   

## 2021-05-28 ENCOUNTER — Telehealth (HOSPITAL_COMMUNITY): Payer: Self-pay | Admitting: *Deleted

## 2021-05-28 ENCOUNTER — Other Ambulatory Visit (HOSPITAL_COMMUNITY): Payer: Self-pay | Admitting: Psychiatry

## 2021-05-28 MED ORDER — SERTRALINE HCL 100 MG PO TABS: 100.0000 mg | ORAL_TABLET | Freq: Every day | ORAL | 1 refills | Status: DC

## 2021-05-28 NOTE — Telephone Encounter (Signed)
90-day supply of sertraline sent to preferred pharmacy.

## 2021-05-28 NOTE — Telephone Encounter (Signed)
Pharmacy request from [facility] for patients Sertraline to be a 90 day supply per patients request. She has a future appt on 07/12/21 with Brittney NP and was last seen on 04/14/21. She currently has a 30 day rx with two refills so she doesn't need a new rx at this time, but will forward the concern to [place] to decide if she will change it to 90 days.

## 2021-06-28 ENCOUNTER — Ambulatory Visit (INDEPENDENT_AMBULATORY_CARE_PROVIDER_SITE_OTHER): Payer: Medicaid Other | Admitting: Clinical

## 2021-06-28 DIAGNOSIS — F331 Major depressive disorder, recurrent, moderate: Secondary | ICD-10-CM | POA: Diagnosis not present
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
## 2021-06-30 DIAGNOSIS — H5213 Myopia, bilateral: Secondary | ICD-10-CM | POA: Diagnosis not present

## 2021-07-03 NOTE — Plan of Care (Signed)
  Problem: Depression CCP Problem  1 coping skills Goal: LTG: Tiffany [name] WILL SCORE LESS THAN 10 ON THE PATIENT HEALTH QUESTIONNAIRE (PHQ-9) Outcome: Progressing Goal: STG: Tiffany [name] WILL COMPLETE AT LEAST 80% OF ASSIGNED HOMEWORK Outcome: Progressing Goal: STG: Tiffany [name] WILL IDENTIFY 3 COGNITIVE PATTERNS AND BELIEFS THAT SUPPORT DEPRESSION Outcome: Progressing   

## 2021-07-12 ENCOUNTER — Encounter (HOSPITAL_COMMUNITY): Payer: Self-pay | Admitting: Psychiatry

## 2021-07-12 ENCOUNTER — Telehealth (INDEPENDENT_AMBULATORY_CARE_PROVIDER_SITE_OTHER): Payer: Medicaid Other | Admitting: Psychiatry

## 2021-07-12 DIAGNOSIS — F32A Depression, unspecified: Secondary | ICD-10-CM | POA: Insufficient documentation

## 2021-07-12 HISTORY — DX: Depression, unspecified: F32.A

## 2021-07-12 MED ORDER — SERTRALINE HCL 100 MG PO TABS: 100.0000 mg | ORAL_TABLET | Freq: Every day | ORAL | 3 refills | Status: DC

## 2021-07-12 NOTE — Progress Notes (Signed)
BH MD/PA/NP OP Progress Note Virtual Visit via Video Note  I connected with [name] on 07/12/21 at 11:00 AM EDT by a video enabled telemedicine application and verified that I am speaking with the correct person using two identifiers.  Location: Patient: Home Provider: Clinic   I discussed the limitations of evaluation and management by telemedicine and the availability of in person appointments. The patient expressed understanding and agreed to proceed.  I provided 30 minutes of non-face-to-face time during this encounter.   07/12/2021 10:57 AM ROBBYE DEDE  MRN:  [id number]  Chief Complaint: "I feel better"  HPI: 21 year old female seen today for follow-up psychiatric evaluation.  She has a psychiatric history of ADHD, adjustment disorder, anxiety, depression, and dyslexia.  She is currently managed on Zoloft 100 mg daily.  She informed Clinical research associate that her medications are effective in managing her psychiatric conditions.  Today she was well-groomed, pleasant, cooperative, engaged in conversation, maintained eye contact.  She informed Clinical research associate that since her last visit she has been feeling better.  She reports that her anxiety and depression are well managed.  Provider conducted a GAD-7 and patient scored a 10.  She notes that recently her boyfriend's cat died and she worries about her animal dying.  She however notes that she is able to cope with it.  Provider also conducted PHQ-9 and patient scored an 11.  She endorsed adequate sleep and appetite.  Today she denies SI/HI/VAH, mania, paranoia.  No medication changes made today.  Patient agreeable to continue medications as prescribed.  No other concerns at this time. Visit Diagnosis:    ICD-10-CM   1. Mild depression  F32.A sertraline (ZOLOFT) 100 MG tablet      Past Psychiatric History: ADHD, adjustment disorder, anxiety, depression, and dyslexia.  Past Medical History:  Past Medical History:  Diagnosis Date   ADHD (attention  deficit hyperactivity disorder) 2004   Took Adderall for several years, then Dexedrine, now Strattera   Asthma "since infancy"   triggers more in winter, has had bronchitis/pneumonia   Dyslexia    Mood disorder (HCC) 2013    Past Surgical History:  Procedure Laterality Date   ADENOIDECTOMY     OTHER SURGICAL HISTORY     tubes in the ears   TONSILLECTOMY     TYMPANOSTOMY TUBE PLACEMENT      Family Psychiatric History: Mother learning disability, substance use, brother learning disability, maternal grandmother learning disability, depression, anxiety  Family History:  Family History  Problem Relation Age of Onset   Learning disabilities Mother    Mental illness Mother    Drug abuse Mother    Learning disabilities Brother    Diabetes Maternal Uncle    Heart disease Maternal Grandmother    Hypertension Maternal Grandmother    Learning disabilities Maternal Grandmother    Depression Maternal Grandmother    Anxiety disorder Maternal Grandmother    Learning disabilities Maternal Grandfather    Heart murmur Maternal Grandfather    Alcohol abuse Neg Hx    Clotting disorder Neg Hx    Mental illness Father    Heart disease Father     Social History:  Social History   Socioeconomic History   Marital status: Single    Spouse name: Not on file   Number of children: Not on file   Years of education: Not on file   Highest education level: Not on file  Occupational History   Not on file  Tobacco Use   Smoking status:  Never   Smokeless tobacco: Never   Tobacco comments:    mom quit!  Substance and Sexual Activity   Alcohol use: Not on file   Drug use: Not on file   Sexual activity: Not on file  Other Topics Concern   Not on file  Social History Narrative   Not on file   Social Determinants of Health   Financial Resource Strain: Not on file  Food Insecurity: Not on file  Transportation Needs: Not on file  Physical Activity: Not on file  Stress: Not on file  Social  Connections: Not on file    Allergies:  Allergies  Allergen Reactions   Augmentin [Amoxicillin-Pot Clavulanate] Hives and Other (See Comments)        Bee Pollen    Latex    Penicillins Rash    Metabolic Disorder Labs: No results found for: "HGBA1C", "MPG" No results found for: "PROLACTIN" No results found for: "CHOL", "TRIG", "HDL", "CHOLHDL", "VLDL", "LDLCALC" No results found for: "TSH"  Therapeutic Level Labs: No results found for: "LITHIUM" No results found for: "VALPROATE" No results found for: "CBMZ"  Current Medications: Current Outpatient Medications  Medication Sig Dispense Refill   metroNIDAZOLE (METROGEL) 1 % gel Apply topically daily. Apply to cheeks 2x daily while symptoms persist 45 g 1   sertraline (ZOLOFT) 100 MG tablet Take 1 tablet (100 mg total) by mouth daily. 90 tablet 3   No current facility-administered medications for this visit.     Musculoskeletal: Strength & Muscle Tone: within normal limits and telehealth visit Gait & Station: normal, telehealth visit Patient leans: N/A  Psychiatric Specialty Exam: Review of Systems  There were no vitals taken for this visit.There is no height or weight on file to calculate BMI.  General Appearance: Well Groomed  Eye Contact:  Good  Speech:  Clear and Coherent and Normal Rate  Volume:  Normal  Mood:  Euthymic  Affect:  Appropriate and Congruent  Thought Process:  Coherent, Goal Directed, and Linear  Orientation:  Full (Time, Place, and Person)  Thought Content: WDL and Logical   Suicidal Thoughts:  No  Homicidal Thoughts:  No  Memory:  Immediate;   Good Recent;   Good Remote;   Good  Judgement:  Good  Insight:  Good  Psychomotor Activity:  Normal  Concentration:  Concentration: Good and Attention Span: Good  Recall:  Good  Fund of Knowledge: Good  Language: Good  Akathisia:  No  Handed:  Right  AIMS (if indicated): not done  Assets:  Communication Skills Desire for Improvement Financial  Resources/Insurance Housing Intimacy Physical Health Social Support  ADL's:  Intact  Cognition: WNL  Sleep:  Good   Screenings: GAD-7    Flowsheet Row Video Visit from 07/12/2021 in [facility] Counselor from 04/09/2021 in [facility] Office Visit from 02/02/2021 in [facility]  Total GAD-7 Score 10 12 7       PHQ2-9    Flowsheet Row Video Visit from 07/12/2021 in [facility] Counselor from 04/09/2021 in [facility] Office Visit from 02/02/2021 in [facility]  PHQ-2 Total Score 3 4 3   PHQ-9 Total Score 11 9 12       Flowsheet Row Counselor from 04/09/2021 in [facility] Office Visit from 02/02/2021 in [facility] ED from 08/16/2020 in [facility] RISK CATEGORY No Risk  No Risk No Risk        Assessment and Plan: Patient notes that she is doing well on her current medication regimen.  No medication changes made today.  Patient agreeable to continue medications as prescribed.  1. Mild depression  Continue- sertraline (ZOLOFT) 100 MG tablet; Take 1 tablet (100 mg total) by mouth daily.  Dispense: 90 tablet; Refill: 3    Collaboration of Care: Collaboration of Care: Other provider involved in patient's care AEB counselor  Patient/Guardian was advised Release of Information must be obtained prior to any record release in order to collaborate their care with an outside provider. Patient/Guardian was advised if they have not already done so to contact the registration department to sign all necessary forms in order for [place] to release information regarding their care.   Consent: Patient/Guardian gives verbal consent for treatment and assignment of benefits for services provided during this visit. Patient/Guardian expressed  understanding and agreed to proceed.   Follow-up in 3 months Follow-up with therapy  Shanna Cisco, NP 07/12/2021, 10:57 AM

## 2021-07-29 ENCOUNTER — Ambulatory Visit (INDEPENDENT_AMBULATORY_CARE_PROVIDER_SITE_OTHER): Payer: Medicaid Other | Admitting: Clinical

## 2021-07-29 DIAGNOSIS — F32A Depression, unspecified: Secondary | ICD-10-CM | POA: Diagnosis not present
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
## 2021-07-30 NOTE — Plan of Care (Signed)
  Problem: Depression CCP Problem  1 coping skills Goal: LTG: Nalleli WILL SCORE LESS THAN 10 ON THE PATIENT HEALTH QUESTIONNAIRE (PHQ-9) Outcome: Progressing Goal: STG: Lannie WILL COMPLETE AT LEAST 80% OF ASSIGNED HOMEWORK Outcome: Progressing Goal: STG: Nicolle WILL IDENTIFY 3 COGNITIVE PATTERNS AND BELIEFS THAT SUPPORT DEPRESSION Outcome: Progressing   

## 2021-08-26 ENCOUNTER — Ambulatory Visit (INDEPENDENT_AMBULATORY_CARE_PROVIDER_SITE_OTHER): Payer: Medicaid Other | Admitting: Clinical

## 2021-08-26 DIAGNOSIS — F32A Depression, unspecified: Secondary | ICD-10-CM

## 2021-08-26 NOTE — Plan of Care (Signed)
  Problem: Depression CCP Problem  1 coping skills Goal: LTG: Maxx WILL SCORE LESS THAN 10 ON THE PATIENT HEALTH QUESTIONNAIRE (PHQ-9) Outcome: Progressing Goal: STG: Terrika WILL COMPLETE AT LEAST 80% OF ASSIGNED HOMEWORK Outcome: Progressing Goal: STG: Chelsia WILL IDENTIFY 3 COGNITIVE PATTERNS AND BELIEFS THAT SUPPORT DEPRESSION Outcome: Progressing   

## 2021-08-26 NOTE — Progress Notes (Signed)
THERAPIST PROGRESS NOTE  Session Time: 45 minutes  Participation Level: Active  Behavioral Response: CasualAlertEuthymic  Type of Therapy: Individual Therapy  Treatment Goals addressed: Client was feeling less and 10 on the PHQ-9; client will identify 3 cognitive patterns and believes that support depression  ProgressTowards Goals: Progressing  Interventions: CBT and Supportive  Summary:  Tiffany [name] is a 20 y.o. female who presents for the scheduled appointment oriented times five, appropriately dressed, and friendly. Client denied hallucinations and delusions. Client reported on today she is doing well.  Client reported today she is preparing to visit her maternal grandparents house.  Client reported her current parents are her [place].  Client reported she is nervous about going over there because they may think she is a disappointment.  Client reported she has those thoughts because she is not a [place] anymore and believes in greek gods.  Client reported her grandmother usually find something to argue with her about which usually causes her anxiety.  Client reported there are days when she still has depressive symptoms.  Client reported sometimes it passes fairly quickly and other times it lingers for a day.  Client reported her mind mainly races at night thinking of "you are going to be alone and other people leave; is everything you are doing now worth it".  Client reported she distracts herself by listening to music or laying down with her dog.  Client reported after a period of time of doing that she feels better and comes around to the conclusion that she cannot control her future which helps her thoughts and emotions to feel more manageable.  Client reported likewise with triggers that she has related to her feelings of abandonment her childhood experience she either tries to distract herself or find ways to keep herself in proximity of the trigger and work to improve how she  feels while sitting with the motion when exposed to the trigger.  Client reported she has been spending time with prioritizing herself but has some feelings of guilt because she has friends that are not working hard to better themselves. Evidence of progress towards goal: Client reported practicing 2 positive behavioral activation activities to help alleviate depressive symptoms at least 3 days out of 7.  Client also reported 1 exposure technique to help create balanced emotions to the trigger overtime.    08/26/2021    9:40 AM 07/29/2021    9:36 AM 07/12/2021   10:45 AM 04/09/2021    9:21 AM  GAD 7 : Generalized Anxiety Score  Nervous, Anxious, on Edge 1 1 2 2   Control/stop worrying 1 1 3 3   Worry too much - different things 1 1 1 3   Trouble relaxing 1 1 1 1   Restless 1 0 0 1  Easily annoyed or irritable 1 1 2 1   Afraid - awful might happen 1 1 1 1   Total GAD 7 Score 7 6 10 12   Anxiety Difficulty Not difficult at all Not difficult at all Somewhat difficult Very difficult     Flowsheet Row Counselor from 08/26/2021 in [facility]  PHQ-9 Total Score 10        Suicidal/Homicidal: Nowithout intent/plan  Therapist Response:  Therapist began the appointment asking the client how she has been doing since last seen. Therapist used CBT to engage using active listening and positive emotional support. Therapist used CBT to engage and ask the client to discuss triggering situations and how she handles and reacts in those  situations. Therapist used CBT to engage and role play how to respond to certain remarks being made. Therapist used CBT to engage and discuss appropriate boundaries and how to prioritize herself. Therapist used CBT ask the client to identify her progress with frequency of use with coping skills with continued practice in her daily activity.    Therapist assigned the client homework to practice small goal of how she prioritizes herself.    Plan:  Return again in 4 weeks.  Diagnosis: mild depression  Collaboration of Care: Patient refused AEB none requested by the client at this time.  Patient/Guardian was advised Release of Information must be obtained prior to any record release in order to collaborate their care with an outside provider. Patient/Guardian was advised if they have not already done so to contact the registration department to sign all necessary forms in order for [place] to release information regarding their care.   Consent: Patient/Guardian gives verbal consent for treatment and assignment of benefits for services provided during this visit. Patient/Guardian expressed understanding and agreed to proceed.   [name] Tiyon Sanor, LCSW 08/26/2021

## 2021-09-23 ENCOUNTER — Ambulatory Visit (INDEPENDENT_AMBULATORY_CARE_PROVIDER_SITE_OTHER): Payer: Medicaid Other | Admitting: Clinical

## 2021-09-23 DIAGNOSIS — F4310 Post-traumatic stress disorder, unspecified: Secondary | ICD-10-CM
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
## 2021-09-23 NOTE — Plan of Care (Signed)
  Problem: Depression CCP Problem  1 coping skills Goal: LTG: S[place]ap WILL SCORE LESS THAN 10 ON THE PATIENT HEALTH QUESTIONNAIRE (PHQ-9) Outcome: Progressing Goal: STG: Helayna WILL COMPLETE AT LEAST 80% OF ASSIGNED HOMEWORK Outcome: Progressing Goal: STG: Dosha WILL IDENTIFY 3 COGNITIVE PATTERNS AND BELIEFS THAT SUPPORT DEPRESSION Outcome: Progressing   

## 2021-10-12 ENCOUNTER — Encounter (HOSPITAL_COMMUNITY): Payer: Self-pay | Admitting: Student in an Organized Health Care Education/Training Program

## 2021-10-12 ENCOUNTER — Ambulatory Visit (INDEPENDENT_AMBULATORY_CARE_PROVIDER_SITE_OTHER): Payer: Medicaid Other | Admitting: Student in an Organized Health Care Education/Training Program

## 2021-10-12 ENCOUNTER — Telehealth (HOSPITAL_COMMUNITY): Payer: Medicaid Other | Admitting: Psychiatry

## 2021-10-12 VITALS — BP 137/75 | HR 88 | Resp 12 | Wt 206.0 lb

## 2021-10-12 DIAGNOSIS — F4312 Post-traumatic stress disorder, chronic: Secondary | ICD-10-CM

## 2021-10-12 DIAGNOSIS — F32A Depression, unspecified: Secondary | ICD-10-CM

## 2021-10-12 MED ORDER — SERTRALINE HCL 50 MG PO TABS: 50.0000 mg | ORAL_TABLET | Freq: Every day | ORAL | 1 refills | Status: DC

## 2021-10-12 MED ORDER — SERTRALINE HCL 100 MG PO TABS: 100.0000 mg | ORAL_TABLET | Freq: Every day | ORAL | 1 refills | Status: DC
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
## 2021-10-26 ENCOUNTER — Ambulatory Visit (INDEPENDENT_AMBULATORY_CARE_PROVIDER_SITE_OTHER): Payer: Medicaid Other | Admitting: Clinical

## 2021-10-26 DIAGNOSIS — F331 Major depressive disorder, recurrent, moderate: Secondary | ICD-10-CM | POA: Diagnosis not present

## 2021-10-26 NOTE — Progress Notes (Signed)
   THERAPIST PROGRESS NOTE  Session Time: 45 minutes  Participation Level: Active  Behavioral Response: CasualAlertDepressed  Type of Therapy: Individual Therapy  Treatment Goals addressed: Client will complete 80% of assigned homework  ProgressTowards Goals: Progressing  Interventions: CBT  Summary:  Tiffany [name] is a 21 y.o. female who presents for the scheduled appointment oriented x5, appropriately dressed, and friendly.  Client denied hallucinations and delusions. Client reported on today she has been doing pretty well. Client reported her mother broke up with her boyfriend so they had to move out of the house. Client reported they are now living at her grandparents house. Client reported she and her brother have a history of instability. Client reported her moms' recent ex would say to her that she "doesn't know anything" or "should go make a sandwich". Client reported he would also make similar comments and start petty arguments with her mother. Client reported over the years she and her brother have always taken away positive and negative things from each of their mothers relationships. Client reported she had a talk with her mom that she can't continue to be present to pick up the pieces from relationships she knows she shouldn't be in. Client reported sometimes feeling guilty she is in a good relationship and her mom is not. Client reported she has seasonal depression towards the end of the year. Client reported when she was younger her great grandmother died around [place] and her father died in 10-09-2022 a few years ago. Client reported she has thoughts about when she is married and won't have her dad to walk her down the aisle. Client reported a night is when she struggles most with thoughts of her loved ones dying and ending up alone. Evidence of progress towards goal:  client reported using positive distraction of music or video game to hep reframe thoughts at least 5 days per  week.    Suicidal/Homicidal: Nowithout intent/plan  Therapist Response:  Therapist began the appointment asking the client how she has been doing since last seen. Therapist used CBT to engage in active listening and positive emotional support. Therapist used CBT to get the client time to discuss her recent change in home and family dynamic. Therapist used CBT to ask the client how it triggers memories of her childhood experiences and normalized her emotional response. Therapist used CBT ask the client to identify her progress with frequency of use with coping skills with continued practice in her daily activity.    Therapist assigned the client to practice assertive communication and self care.   Plan: Return again in 3 weeks.  Diagnosis: MDD, recurrent episode, moderate  Collaboration of Care: Patient refused AEB none requested by the client.  Patient/Guardian was advised Release of Information must be obtained prior to any record release in order to collaborate their care with an outside provider. Patient/Guardian was advised if they have not already done so to contact the registration department to sign all necessary forms in order for [place] to release information regarding their care.   Consent: Patient/Guardian gives verbal consent for treatment and assignment of benefits for services provided during this visit. Patient/Guardian expressed understanding and agreed to proceed.   [place], LCSW 10/26/2021

## 2021-11-01 NOTE — Plan of Care (Signed)
  Problem: Depression CCP Problem  1 coping skills Goal: LTG: Parker WILL SCORE LESS THAN 10 ON THE PATIENT HEALTH QUESTIONNAIRE (PHQ-9) Outcome: Progressing Goal: STG: Bryannah WILL COMPLETE AT LEAST 80% OF ASSIGNED HOMEWORK Outcome: Progressing Goal: STG: Kenli WILL IDENTIFY 3 COGNITIVE PATTERNS AND BELIEFS THAT SUPPORT DEPRESSION Outcome: Progressing

## 2021-11-23 ENCOUNTER — Ambulatory Visit (INDEPENDENT_AMBULATORY_CARE_PROVIDER_SITE_OTHER): Payer: Medicaid Other | Admitting: Clinical

## 2021-11-23 DIAGNOSIS — F32A Depression, unspecified: Secondary | ICD-10-CM

## 2021-11-23 NOTE — Progress Notes (Signed)
THERAPIST PROGRESS NOTE  Session Time: 30 minutes  Participation Level: Active  Behavioral Response: CasualAlertEuthymic  Type of Therapy: Individual Therapy  Treatment Goals addressed: Client will complete at least 80% of assigned homework  ProgressTowards Goals: Progressing  Interventions: CBT and Supportive  Summary:  Tiffany [name] is a 21 y.o. female who presents for the scheduled appointment oriented x5, appropriately dressed. Client denies hallucinations or delusions. Client reported today she is doing pretty well.  Client reported over the past few weeks she has come to learn that she and her mom's ex-boyfriend requesting to move out of the home are back together.  Client reported she had a discussion with her mother about her boundaries about her relationship.  Client reported ultimately she told her mom as long as she is stating that boyfriend she does not want to be included in the relationship.  Client reported by history her mom always comes to her to pick up the pieces after she brings up with one of her boyfriends.  Client reported her mom was taken back by her statement that the client feels that it is appropriate for her to put in boundaries because she wants to have a stable environment.  Client reported she feels good about standing up for herself.  Client reported otherwise she is looking forward to Thanksgiving as well as Christmas will be new for her as she will be spending it with her boyfriend and his dad side of the family here in [place].  Client reported she is a little bit nervous but is overall excited.  Client reported surprisingly she has been doing well with falling and staying asleep on her own evidenced by not having to take melatonin as she has been for the longest.  Client reported she is not sure what has caused the change. Evidence of progress towards goal: Client reported 2 improvements of not having to take melatonin within the past 7 days as well  as using appropriate assertive communication to vocalize her boundaries.   Suicidal/Homicidal: Nowithout intent/plan  Therapist Response:  Therapist began the appointment asking the client how she has been doing since last seen. Therapist used CBT to engage using active listening and positive emotional support. Therapist used CBT to ask client about medication compliance compared to her overall severity of symptoms. Therapist used CBT to engage with the client to ask her about changes in her family dynamic and how she is coping with the transition. Therapist used CBT to positively acknowledge and reinforced the clients use of utilizing appropriate boundaries to preserve her emotional wellbeing. Therapist used CBT ask the client to identify her progress with frequency of use with coping skills with continued practice in her daily activity.    Therapist assigned client homework to continue practicing self-care, utilizing boundaries, and looking forward to positive events.   Plan: Return again in 3 weeks.  Diagnosis: Mild depression  Collaboration of Care: Patient refused AEB none requested by the client at this time.  Patient/Guardian was advised Release of Information must be obtained prior to any record release in order to collaborate their care with an outside provider. Patient/Guardian was advised if they have not already done so to contact the registration department to sign all necessary forms in order for [place] to release information regarding their care.   Consent: Patient/Guardian gives verbal consent for treatment and assignment of benefits for services provided during this visit. Patient/Guardian expressed understanding and agreed to proceed.   Tiffany Rhymes [place], LCSW  11/23/2021  

## 2021-11-23 NOTE — Plan of Care (Signed)
  Problem: Depression CCP Problem  1 coping skills Goal: LTG: Tiffany [name] WILL SCORE LESS THAN 10 ON THE PATIENT HEALTH QUESTIONNAIRE (PHQ-9) Outcome: Progressing Goal: STG: Tiffany [name] WILL COMPLETE AT LEAST 80% OF ASSIGNED HOMEWORK Outcome: Progressing Goal: STG: Tiffany [name] WILL IDENTIFY 3 COGNITIVE PATTERNS AND BELIEFS THAT SUPPORT DEPRESSION Outcome: Progressing

## 2021-12-14 ENCOUNTER — Ambulatory Visit (INDEPENDENT_AMBULATORY_CARE_PROVIDER_SITE_OTHER): Payer: Medicaid Other | Admitting: Student in an Organized Health Care Education/Training Program

## 2021-12-14 ENCOUNTER — Encounter (HOSPITAL_COMMUNITY): Payer: Self-pay | Admitting: Student in an Organized Health Care Education/Training Program

## 2021-12-14 DIAGNOSIS — F32A Depression, unspecified: Secondary | ICD-10-CM | POA: Diagnosis not present

## 2021-12-14 DIAGNOSIS — F4312 Post-traumatic stress disorder, chronic: Secondary | ICD-10-CM

## 2021-12-14 MED ORDER — SERTRALINE HCL 100 MG PO TABS: 150.0000 mg | ORAL_TABLET | Freq: Every day | ORAL | 1 refills | Status: DC
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
## 2021-12-21 ENCOUNTER — Ambulatory Visit (INDEPENDENT_AMBULATORY_CARE_PROVIDER_SITE_OTHER): Payer: Medicaid Other | Admitting: Clinical

## 2021-12-21 DIAGNOSIS — F32A Depression, unspecified: Secondary | ICD-10-CM | POA: Diagnosis not present

## 2021-12-23 NOTE — Progress Notes (Signed)
   THERAPIST PROGRESS NOTE  Session Time: 40 minutes  Participation Level: Active  Behavioral Response: CasualAlertEuthymic  Type of Therapy: Individual Therapy  Treatment Goals addressed: client will identify 3 cognitive patterns and beliefs that support depression  ProgressTowards Goals: Progressing  Interventions: CBT and Supportive  Summary:  Tiffany [name] is a 21 y.o. female who presents for the scheduled appointment oriented times five, appropriately dressed, and friendly. Client denied hallucinations and delusions. Client reported on today she is doing pretty well. Client reported there has been stress in the household pertaining to her grandmother. Client reported her grandmother does not want to see others do well. Client reported she has been changing things by using assertive communication with her grandmother to speak up for herself. Client reported her grandmother can be a bully. Client reported as opposed to before when she did not melatonin for a few weeks she now is having difficulty with falling asleep. Client reported she does not want to rely on night time medications. Client reported otherwise her boyfriend will be coming down to spend the holidays with his dad side of the family who is here in [place]. Evidence of progress towards goal:  client reported using 1 coping skill of assertive communication as needed in interpersonal relationship.   Suicidal/Homicidal: Nowithout intent/plan  Therapist Response:  Therapist began the appointment asking the client how she has been since last seen. Therapist used CBT to engage using active listening and positive emotional support. Therapist used CBT to engage and give the client time to discuss her challenges with family. Therapist used CBT to discuss boundaries and assertive communication. Therapist used CBT ask the client to identify her progress with frequency of use with coping skills with continued practice in her  daily activity.    Therapist assigned the client homework for self care.   Plan: Return again in 3 weeks.  Diagnosis: mild depression  Collaboration of Care: Patient refused AEB none requested by the client.  Patient/Guardian was advised Release of Information must be obtained prior to any record release in order to collaborate their care with an outside provider. Patient/Guardian was advised if they have not already done so to contact the registration department to sign all necessary forms in order for [place] to release information regarding their care.   Consent: Patient/Guardian gives verbal consent for treatment and assignment of benefits for services provided during this visit. Patient/Guardian expressed understanding and agreed to proceed.   [name] Ollivander See, LCSW 12/21/2021

## 2022-01-18 ENCOUNTER — Ambulatory Visit (INDEPENDENT_AMBULATORY_CARE_PROVIDER_SITE_OTHER): Payer: Medicaid Other | Admitting: Clinical

## 2022-01-18 DIAGNOSIS — F32A Depression, unspecified: Secondary | ICD-10-CM

## 2022-01-18 NOTE — Progress Notes (Signed)
   THERAPIST PROGRESS NOTE  Session Time: 40 minutes  Participation Level: Active  Behavioral Response: CasualAlertEuthymic  Type of Therapy: Individual Therapy  Treatment Goals addressed: client will complete 80% of assigned homework  ProgressTowards Goals: Progressing  Interventions: CBT and Supportive  Summary:  Tiffany [name] is a 22 y.o. female who presents for the scheduled appointment oriented times five, appropriately dressed, and friendly. Client denied hallucinations and delusions. Client reported on today she is doing well. Client reported her boyfriend came to [place] and visited for 2 weeks. Client reported they also traveled to [place], [place] and [place]. Client reported she met his mom at the apartment where they live. Client reported his mother is a Ship broker. Client reported his mother is hoarder and it gave her anxiety to see it. Client reported her boyfriend has been talking about marriage. Client reported she wants to marry him but is scared to due to seeing her mother being divorced so many times. Client reported living with her grandparents has been stressful at times because her grandmother is easily agitated and starts arguments with her. Client reported over the years having some feelings of dislike towards her grandma because she didn't prioritize wanting to spend with her and her brother among other things. Client reported she feels proud of herself over the past year for "finding herself". Client reported others including her grandma. Evidence of progress towards goal:  client reported using assertive communication at least 3x per week tell her interpersonal relationship persons things that make her uncomfortable which has helped improve her self esteem.   Suicidal/Homicidal: Nowithout intent/plan  Therapist Response:  Therapist began the appointment asking the client how she has been doing. Therapist used CBT to engage using active listening and  positive emotional support. Therapist used CBT to ask the client to identify sources of her depression and/or PTSD as it relates to current triggers. Therapist used CBT to engage the client in reframing how she responds to triggers. Therapist used CBT ask the client to identify her progress with frequency of use with coping skills with continued practice in her daily activity.    Therapist assigned the client homework to practice self care.     Plan: Return again in 3 weeks.  Diagnosis: mild depression  Collaboration of Care: Patient refused AEB none requested by the client.  Patient/Guardian was advised Release of Information must be obtained prior to any record release in order to collaborate their care with an outside provider. Patient/Guardian was advised if they have not already done so to contact the registration department to sign all necessary forms in order for [place] to release information regarding their care.   Consent: Patient/Guardian gives verbal consent for treatment and assignment of benefits for services provided during this visit. Patient/Guardian expressed understanding and agreed to proceed.   [place], LCSW 01/18/2022

## 2022-02-23 ENCOUNTER — Ambulatory Visit (INDEPENDENT_AMBULATORY_CARE_PROVIDER_SITE_OTHER): Payer: Medicaid Other | Admitting: Clinical

## 2022-02-23 DIAGNOSIS — F331 Major depressive disorder, recurrent, moderate: Secondary | ICD-10-CM

## 2022-02-23 NOTE — Progress Notes (Signed)
   THERAPIST PROGRESS NOTE  Session Time: 45 minutes  Participation Level: Active  Behavioral Response: CasualAlertDepressed  Type of Therapy: Individual Therapy  Treatment Goals addressed: client will identify 3  cognitive patterns and beliefs that support depression   ProgressTowards Goals: Progressing  Interventions: CBT and Supportive  Summary:  Tiffany [name] is a 22 y.o. female who presents for the scheduled appointment oriented times five, appropriately dressed, and friendly. Client denied hallucinations and delusions. Client reported she recently celebrated her brother and grandfathers birthdays. Client reported it is usually something negative that happens around this time of year. Client reported she baked a cake and cooked but her grandparents did not save food for her mother. Client reported she continuously has issues with trying to talk to her grandmother that become arguments. Client reported her mother has made statements about them needing to move out because it is hard to get along with her grandparents who criticize them for everything. Client reported her hope for the future is to live with her boyfriend and have her own life. Client reported one of her passions is baking and she did want to go to school for it and wants to in the future. Client reported she has been working on her PTSD symptoms so she can function outside of her bubble. Client reported she had an incident approx. valentines day when her boyfriend forgot about plans they had made to spend together virtually. Client reported 2 months ago in combination with everything else going on in her family she developed passive suicidal ideations. Client reported she took approx 6 OTC ibuprofen and had intent to take her psych medications. Client reported not having suicidal ideations currently. Evidence of progress towards goal:  client reported 2 cognitive distortions and beliefs about herself and view of the world that  contributed to her depression and anxiety symptoms.   Suicidal/Homicidal: Nowithout intent/plan  Therapist Response:  Therapist began the appointment asking the client how she has been doing since last seen. Therapist used CBT to engage using active listening and positive emotional support. Therapist used CBT to ask the client open ended questions about severity of her depressive symptoms and the origin. Therapist used CBT to discuss coping skills for depression, safety planning and reinforcing use of reframing negative thoughts. Therapist used CBT ask the client to identify her progress with frequency of use with coping skills with continued practice in her daily activity.    Therapist assigned the client homework to practice self care.    Plan: Return again in 3 weeks.  Diagnosis: MDD, recurrent episode, moderate  Collaboration of Care: Patient refused AEB none requested by the client.  Patient/Guardian was advised Release of Information must be obtained prior to any record release in order to collaborate their care with an outside provider. Patient/Guardian was advised if they have not already done so to contact the registration department to sign all necessary forms in order for [place] to release information regarding their care.   Consent: Patient/Guardian gives verbal consent for treatment and assignment of benefits for services provided during this visit. Patient/Guardian expressed understanding and agreed to proceed.   [place], LCSW 02/23/2022

## 2022-02-24 ENCOUNTER — Ambulatory Visit (HOSPITAL_COMMUNITY): Payer: Medicaid Other | Admitting: Clinical

## 2022-03-02 DIAGNOSIS — M79671 Pain in right foot: Secondary | ICD-10-CM | POA: Diagnosis not present

## 2022-03-16 ENCOUNTER — Encounter (HOSPITAL_COMMUNITY): Payer: Medicaid Other | Admitting: Student in an Organized Health Care Education/Training Program

## 2022-03-25 ENCOUNTER — Ambulatory Visit (INDEPENDENT_AMBULATORY_CARE_PROVIDER_SITE_OTHER): Payer: Medicaid Other | Admitting: Student in an Organized Health Care Education/Training Program

## 2022-03-25 DIAGNOSIS — F4312 Post-traumatic stress disorder, chronic: Secondary | ICD-10-CM

## 2022-03-25 DIAGNOSIS — F32A Depression, unspecified: Secondary | ICD-10-CM | POA: Diagnosis not present

## 2022-03-25 MED ORDER — SERTRALINE HCL 100 MG PO TABS: 150.0000 mg | ORAL_TABLET | Freq: Every day | ORAL | 2 refills | Status: DC
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
## 2022-03-29 ENCOUNTER — Ambulatory Visit (INDEPENDENT_AMBULATORY_CARE_PROVIDER_SITE_OTHER): Payer: Medicaid Other | Admitting: Clinical

## 2022-03-29 DIAGNOSIS — F331 Major depressive disorder, recurrent, moderate: Secondary | ICD-10-CM
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
## 2022-04-16 ENCOUNTER — Ambulatory Visit (HOSPITAL_COMMUNITY)
Admission: EM | Admit: 2022-04-16 | Discharge: 2022-04-17 | Disposition: A | Discharge status: Other Acute Inpatient Hospital | Payer: Medicaid Other | Attending: Nurse Practitioner | Admitting: Nurse Practitioner

## 2022-04-16 DIAGNOSIS — R45851 Suicidal ideations: Secondary | ICD-10-CM | POA: Insufficient documentation

## 2022-04-16 DIAGNOSIS — F4310 Post-traumatic stress disorder, unspecified: Secondary | ICD-10-CM | POA: Insufficient documentation

## 2022-04-16 DIAGNOSIS — Z1152 Encounter for screening for COVID-19: Secondary | ICD-10-CM | POA: Insufficient documentation

## 2022-04-16 DIAGNOSIS — F331 Major depressive disorder, recurrent, moderate: Secondary | ICD-10-CM | POA: Insufficient documentation

## 2022-04-17 ENCOUNTER — Encounter (HOSPITAL_COMMUNITY): Payer: Self-pay | Admitting: Psychiatry

## 2022-04-17 ENCOUNTER — Inpatient Hospital Stay (HOSPITAL_COMMUNITY)
Admission: AD | Admit: 2022-04-17 | Discharge: 2022-04-21 | DRG: 885 | Disposition: A | Discharge status: Home / Self Care | Payer: Medicaid Other | Attending: Psychiatry | Admitting: Psychiatry

## 2022-04-17 ENCOUNTER — Other Ambulatory Visit: Payer: Self-pay

## 2022-04-17 DIAGNOSIS — Z9151 Personal history of suicidal behavior: Secondary | ICD-10-CM

## 2022-04-17 DIAGNOSIS — Z818 Family history of other mental and behavioral disorders: Secondary | ICD-10-CM

## 2022-04-17 DIAGNOSIS — Z91040 Latex allergy status: Secondary | ICD-10-CM | POA: Diagnosis not present

## 2022-04-17 DIAGNOSIS — Z8249 Family history of ischemic heart disease and other diseases of the circulatory system: Secondary | ICD-10-CM | POA: Diagnosis not present

## 2022-04-17 DIAGNOSIS — Z91199 Patient's noncompliance with other medical treatment and regimen due to unspecified reason: Secondary | ICD-10-CM

## 2022-04-17 DIAGNOSIS — Z880 Allergy status to penicillin: Secondary | ICD-10-CM | POA: Diagnosis not present

## 2022-04-17 DIAGNOSIS — Z62810 Personal history of physical and sexual abuse in childhood: Secondary | ICD-10-CM

## 2022-04-17 DIAGNOSIS — Z630 Problems in relationship with spouse or partner: Secondary | ICD-10-CM | POA: Diagnosis not present

## 2022-04-17 DIAGNOSIS — Z833 Family history of diabetes mellitus: Secondary | ICD-10-CM

## 2022-04-17 DIAGNOSIS — Z881 Allergy status to other antibiotic agents status: Secondary | ICD-10-CM

## 2022-04-17 DIAGNOSIS — F411 Generalized anxiety disorder: Secondary | ICD-10-CM | POA: Diagnosis present

## 2022-04-17 DIAGNOSIS — J45909 Unspecified asthma, uncomplicated: Secondary | ICD-10-CM | POA: Diagnosis present

## 2022-04-17 DIAGNOSIS — F4310 Post-traumatic stress disorder, unspecified: Secondary | ICD-10-CM | POA: Diagnosis not present

## 2022-04-17 DIAGNOSIS — Z91030 Bee allergy status: Secondary | ICD-10-CM

## 2022-04-17 DIAGNOSIS — Z8701 Personal history of pneumonia (recurrent): Secondary | ICD-10-CM

## 2022-04-17 DIAGNOSIS — Z79899 Other long term (current) drug therapy: Secondary | ICD-10-CM

## 2022-04-17 DIAGNOSIS — Z9152 Personal history of nonsuicidal self-harm: Secondary | ICD-10-CM | POA: Diagnosis not present

## 2022-04-17 DIAGNOSIS — F332 Major depressive disorder, recurrent severe without psychotic features: Secondary | ICD-10-CM

## 2022-04-17 DIAGNOSIS — R480 Dyslexia and alexia: Secondary | ICD-10-CM | POA: Diagnosis present

## 2022-04-17 DIAGNOSIS — Z813 Family history of other psychoactive substance abuse and dependence: Secondary | ICD-10-CM

## 2022-04-17 DIAGNOSIS — F331 Major depressive disorder, recurrent, moderate: Secondary | ICD-10-CM | POA: Diagnosis not present

## 2022-04-17 DIAGNOSIS — Z636 Dependent relative needing care at home: Secondary | ICD-10-CM | POA: Diagnosis not present

## 2022-04-17 DIAGNOSIS — F909 Attention-deficit hyperactivity disorder, unspecified type: Secondary | ICD-10-CM | POA: Diagnosis present

## 2022-04-17 DIAGNOSIS — R45851 Suicidal ideations: Secondary | ICD-10-CM | POA: Diagnosis present

## 2022-04-17 DIAGNOSIS — Z1152 Encounter for screening for COVID-19: Secondary | ICD-10-CM | POA: Diagnosis not present

## 2022-04-17 HISTORY — DX: Major depressive disorder, recurrent severe without psychotic features: F33.2

## 2022-04-17 LAB — POCT URINE DRUG SCREEN - MANUAL ENTRY (I-SCREEN)
POC Amphetamine UR: NOT DETECTED
POC Buprenorphine (BUP): NOT DETECTED
POC Cocaine UR: NOT DETECTED
POC Marijuana UR: NOT DETECTED
POC Methadone UR: NOT DETECTED
POC Methamphetamine UR: NOT DETECTED
POC Morphine: NOT DETECTED
POC Oxazepam (BZO): POSITIVE — AB
POC Oxycodone UR: NOT DETECTED
POC Secobarbital (BAR): NOT DETECTED

## 2022-04-17 LAB — COMPREHENSIVE METABOLIC PANEL
ALT: 16 U/L (ref 0–44)
AST: 18 U/L (ref 15–41)
Albumin: 4.1 g/dL (ref 3.5–5.0)
Alkaline Phosphatase: 61 U/L (ref 38–126)
Anion gap: 11 (ref 5–15)
BUN: 15 mg/dL (ref 6–20)
CO2: 23 mmol/L (ref 22–32)
Calcium: 9.3 mg/dL (ref 8.9–10.3)
Chloride: 105 mmol/L (ref 98–111)
Creatinine, Ser: 0.9 mg/dL (ref 0.44–1.00)
GFR, Estimated: >60 mL/min (ref >60)
Glucose, Bld: 111 mg/dL — ABNORMAL HIGH (ref 70–99)
Potassium: 4.1 mmol/L (ref 3.5–5.1)
Sodium: 139 mmol/L (ref 135–145)
Total Bilirubin: 0.6 mg/dL (ref 0.3–1.2)
Total Protein: 6.4 g/dL — ABNORMAL LOW (ref 6.5–8.1)

## 2022-04-17 LAB — CBC WITH DIFFERENTIAL/PLATELET
Abs Immature Granulocytes: 0.02 10*3/uL (ref 0.00–0.07)
Basophils Absolute: 0 10*3/uL (ref 0.0–0.1)
Basophils Relative: 0 %
Eosinophils Absolute: 0.1 10*3/uL (ref 0.0–0.5)
Eosinophils Relative: 1 %
HCT: 41.1 % (ref 36.0–46.0)
Hemoglobin: 13.9 g/dL (ref 12.0–15.0)
Immature Granulocytes: 0 %
Lymphocytes Relative: 23 %
Lymphs Abs: 1.8 10*3/uL (ref 0.7–4.0)
MCH: 30.9 pg (ref 26.0–34.0)
MCHC: 33.8 g/dL (ref 30.0–36.0)
MCV: 91.3 fL (ref 80.0–100.0)
Monocytes Absolute: 0.4 10*3/uL (ref 0.1–1.0)
Monocytes Relative: 5 %
Neutro Abs: 5.5 10*3/uL (ref 1.7–7.7)
Neutrophils Relative %: 71 %
Platelets: 214 10*3/uL (ref 150–400)
RBC: 4.5 MIL/uL (ref 3.87–5.11)
RDW: 12 % (ref 11.5–15.5)
WBC: 7.8 10*3/uL (ref 4.0–10.5)
nRBC: 0 % (ref 0.0–0.2)

## 2022-04-17 LAB — POC SARS CORONAVIRUS 2 AG: SARSCOV2ONAVIRUS 2 AG: NEGATIVE

## 2022-04-17 LAB — POC URINE PREG, ED: Preg Test, Ur: NEGATIVE

## 2022-04-17 LAB — POCT PREGNANCY, URINE: Preg Test, Ur: NEGATIVE

## 2022-04-17 LAB — SARS CORONAVIRUS 2 BY RT PCR: SARS Coronavirus 2 by RT PCR: NEGATIVE

## 2022-04-17 LAB — ETHANOL: Alcohol, Ethyl (B): <10 mg/dL (ref <10)

## 2022-04-17 MED ORDER — ACETAMINOPHEN 325 MG PO TABS: 650.0000 mg | ORAL_TABLET | Freq: Four times a day (QID) | ORAL | Status: DC | PRN

## 2022-04-17 MED ORDER — DIPHENHYDRAMINE HCL 25 MG PO CAPS: 50.0000 mg | ORAL_CAPSULE | Freq: Three times a day (TID) | ORAL | Status: DC | PRN

## 2022-04-17 MED ORDER — MAGNESIUM HYDROXIDE 400 MG/5ML PO SUSP: 30.0000 mL | Freq: Every day | ORAL | Status: DC | PRN

## 2022-04-17 MED ORDER — SERTRALINE HCL 50 MG PO TABS: 150.0000 mg | ORAL_TABLET | Freq: Every day | ORAL | Status: DC

## 2022-04-17 MED ORDER — ALUM & MAG HYDROXIDE-SIMETH 200-200-20 MG/5ML PO SUSP: 30.0000 mL | ORAL | Status: DC | PRN

## 2022-04-17 MED ORDER — TRAZODONE HCL 50 MG PO TABS: 50.0000 mg | ORAL_TABLET | Freq: Every evening | ORAL | Status: DC | PRN

## 2022-04-17 MED ORDER — LORAZEPAM 2 MG/ML IJ SOLN: 2.0000 mg | Freq: Three times a day (TID) | INTRAMUSCULAR | Status: DC | PRN

## 2022-04-17 MED ORDER — HYDROXYZINE HCL 25 MG PO TABS
25.0000 mg | ORAL_TABLET | Freq: Three times a day (TID) | ORAL | Status: DC | PRN
  Administered 2022-04-18 – 2022-04-20 (×3): 25 mg via ORAL
  Filled 2022-04-17 (×3): qty 1

## 2022-04-17 MED ORDER — LORAZEPAM 1 MG PO TABS: 2.0000 mg | ORAL_TABLET | Freq: Three times a day (TID) | ORAL | Status: DC | PRN

## 2022-04-17 MED ORDER — TRAZODONE HCL 50 MG PO TABS
50.0000 mg | ORAL_TABLET | Freq: Every evening | ORAL | Status: DC | PRN
  Administered 2022-04-18: 50 mg via ORAL
  Filled 2022-04-17 (×3): qty 1

## 2022-04-17 MED ORDER — HYDROXYZINE HCL 25 MG PO TABS: 25.0000 mg | ORAL_TABLET | Freq: Three times a day (TID) | ORAL | Status: DC | PRN

## 2022-04-17 MED ORDER — DIPHENHYDRAMINE HCL 50 MG/ML IJ SOLN: 50.0000 mg | Freq: Three times a day (TID) | INTRAMUSCULAR | Status: DC | PRN

## 2022-04-17 MED ORDER — HALOPERIDOL LACTATE 5 MG/ML IJ SOLN: 5.0000 mg | Freq: Three times a day (TID) | INTRAMUSCULAR | Status: DC | PRN

## 2022-04-17 MED ORDER — SERTRALINE HCL 50 MG PO TABS
150.0000 mg | ORAL_TABLET | Freq: Every day | ORAL | Status: DC
  Administered 2022-04-18: 150 mg via ORAL
  Filled 2022-04-17 (×3): qty 1

## 2022-04-17 MED ORDER — SERTRALINE HCL 50 MG PO TABS
150.0000 mg | ORAL_TABLET | Freq: Every day | ORAL | Status: DC
  Administered 2022-04-17: 150 mg via ORAL
  Filled 2022-04-17: qty 1

## 2022-04-17 MED ORDER — HALOPERIDOL 5 MG PO TABS: 5.0000 mg | ORAL_TABLET | Freq: Three times a day (TID) | ORAL | Status: DC | PRN

## 2022-04-17 NOTE — ED Notes (Signed)
Pt is currently sleeping, no distress noted, environmental check complete, will continue to monitor patient for safety.  

## 2022-04-17 NOTE — ED Notes (Signed)
Rn called to give report was told rn was in meeting advise rn to call back when she could.

## 2022-04-17 NOTE — ED Notes (Signed)
Safe transport called 

## 2022-04-17 NOTE — ED Notes (Signed)
Patient A&O x 4, ambulatory. Patient discharged in no acute distress. Patient denied SI/HI, A/VH upon discharge.  Pt belongings returned to patient from locker #  19 intact. Patient escorted to [place] via staff for transport to [facility]  via safe transport  Safety maintained.
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
## 2022-04-17 NOTE — ED Notes (Signed)
Patient alert and oriented x 3. Denies SI/HI/AVH. Denies intent or plan to harm self or others. Routine conducted according to faculty protocol. Encourage patient to notify staff with any needs or concerns. Patient verbalized agreement and understanding. Will continue to monitor for safety. 

## 2022-04-17 NOTE — Tx Team (Signed)
Initial Treatment Plan 04/17/2022 11:42 AM [name] [id number]    PATIENT STRESSORS: Marital or family conflict   Traumatic event     PATIENT STRENGTHS: Ability for insight  Active sense of humor  General fund of knowledge  Motivation for treatment/growth  Physical Health  Special hobby/interest  Supportive family/friends    PATIENT IDENTIFIED PROBLEMS: "At risk for suicide"   "Depression"   "Anxiety"   "PTSD"   "Relationship issues with boyfriend"              DISCHARGE CRITERIA:  Ability to meet basic life and health needs Improved stabilization in mood, thinking, and/or behavior  PRELIMINARY DISCHARGE PLAN: Outpatient therapy Return to previous living arrangement Return to previous work or school arrangements  PATIENT/FAMILY INVOLVEMENT: This treatment plan has been presented to and reviewed with the patient, Tiffany [name], and/or family member. The patient and family have been given the opportunity to ask questions and make suggestions.  Tyrone Apple, RN 04/17/2022, 11:42 AM

## 2022-04-17 NOTE — Discharge Instructions (Addendum)
Transfer to [facility] for IP admission  
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
## 2022-04-17 NOTE — ED Notes (Signed)
Patient  sleeping in no acute stress. RR even and unlabored .Environment secured .Will continue to monitor for safely. 

## 2022-04-17 NOTE — ED Notes (Signed)
Report given to emily [id number] am
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
## 2022-04-17 NOTE — Progress Notes (Signed)
04/16/22 2344  Patient Reported Information  How Did You Hear About [place]? Family/Friend  What Is the Reason for Your Visit/Call Today? Pt reports, tonight she learned her long distance boyfriend of nine years cheated on her and he might have gotten someone pregnant. Pt reports, suicidal thoughts with a plan to overdose on medications that comes in waves. Pt reports, paranoia from past trauma. Pt denies, HI, current self-injurious behaviors. Pt's mother reports, she has a [address] [employer], the pt does not have access to the gun as she lives in on her parents property in a camper. Pt's mother reports, the pt lives with her grandparents and brother. Pt and mother reports, she can not contract for safety if discharged.  How Long Has This Been Causing You Problems? <Week  What Do You Feel Would Help You the Most Today? Stress Management;Treatment for Depression or other mood problem;Medication(s)  Have You Recently Had Any Thoughts About Hurting Yourself? Yes  Are You Planning to Commit Suicide/Harm Yourself At This time? Yes  Have you Recently Had Thoughts About Hurting Someone [name]? No  Are You Planning To Harm Someone At This Time? No  Explanation: Pt denies, HI.  Have You Used Any Alcohol or Drugs in the Past 24 Hours? No  What Did You Use and How Much? Pt denies, substance use.  Do You Currently Have a Therapist/Psychiatrist? Yes  Name of Therapist/Psychiatrist Pt reports, she's linked to [facility], LCSW at [facility], her next therapy appointment is on Monday (04/18/2022).  Have You Been Recently Discharged From Any Office Practice or Programs? No  Explanation of Discharge From Practice/Program None.  CCA Screening Triage Referral Assessment  Type of Contact Face-to-Face  Location of Assessment [facility] Assessment Services  Provider location [facility] Assessment Services  Collateral Involvement Wylie Griscom, mother, [phone].  Does Patient Have a Automotive engineer Guardian? No   Legal Guardian Contact Information Pt is her own guardian.  Copy of Legal Guardianship Form in Chart No - copy requested  Legal Guardian Notified of Arrival   (Pt is her own guardian.)  Legal Guardian Notified of Pending Discharge   (Pt is her own guardian.)  If Minor and Not Living with Parent(s), Who has Custody? Pt is an adult, pt is her own guardian.  Is CPS involved or ever been involved? In the Past  Is APS involved or ever been involved? Never  Patient Determined To Be At Risk for Harm To Self or Others Based on Review of Patient Reported Information or Presenting Complaint? Yes, for Self-Harm  Method Plan with intent and identified person  Availability of Means In hand or used  Intent Clearly intends on inflicting harm that could cause death  Notification Required No need or identified person  Additional Information for Danger to Others Potential  (Pt denies, HI.)  Additional Comments for Danger to Others Potential Pt denies, HI.  Are There Guns or Other Weapons in Your Home? Yes  Types of Guns/Weapons Pt's mother reports, she has a [address] and [employer] (gun), the pt does not have access to the gun as she lives in on her parents property in a camper.  Are These Weapons Safely Secured? Yes  Who Could Verify You Are Able To Have These Secured: Per mother, the gun is in her camper.  Do You Have any Outstanding Charges, Pending Court Dates, Parole/Probation? Pt denies, legal involvement.  Contacted To Inform of Risk of Harm To Self or Others: Other:  Comment (None.)  Does Patient Present under Involuntary Commitment? No  [place] of Residence [place]  Patient Currently Receiving the Following Services: Individual Therapy  Determination of Need Urgent (48 hours)  Options For Referral [facility] Urgent Care;Medication Management;Inpatient Hospitalization;Outpatient Therapy    Determination of need: Urgent.    Redmond Pulling, MS, [facility], [facility] Triage Specialist [phone]

## 2022-04-17 NOTE — Progress Notes (Signed)
   04/17/22 2100  Psych Admission Type (Psych Patients Only)  Admission Status Voluntary  Psychosocial Assessment  Patient Complaints Anxiety;Depression  Eye Contact Fair  Facial Expression Flat  Affect Appropriate to circumstance  Speech Logical/coherent  Interaction Assertive  Motor Activity Other (Comment) (wnl)  Appearance/Hygiene Unremarkable  Behavior Characteristics Cooperative;Appropriate to situation  Mood Depressed  Thought Process  Coherency WDL  Content Blaming others  Delusions WDL  Perception WDL  Hallucination None reported or observed  Judgment Poor  Confusion WDL  Danger to Self  Current suicidal ideation? Denies
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
## 2022-04-18 ENCOUNTER — Ambulatory Visit (HOSPITAL_COMMUNITY): Payer: Medicaid Other | Admitting: Clinical

## 2022-04-18 ENCOUNTER — Encounter (HOSPITAL_COMMUNITY): Payer: Self-pay

## 2022-04-18 DIAGNOSIS — F411 Generalized anxiety disorder: Secondary | ICD-10-CM | POA: Insufficient documentation

## 2022-04-18 DIAGNOSIS — F332 Major depressive disorder, recurrent severe without psychotic features: Secondary | ICD-10-CM | POA: Diagnosis not present

## 2022-04-18 DIAGNOSIS — F4310 Post-traumatic stress disorder, unspecified: Secondary | ICD-10-CM | POA: Insufficient documentation

## 2022-04-18 MED ORDER — SERTRALINE HCL 50 MG PO TABS
175.0000 mg | ORAL_TABLET | Freq: Every day | ORAL | Status: DC
  Administered 2022-04-19: 175 mg via ORAL
  Filled 2022-04-18 (×2): qty 1

## 2022-04-18 MED ORDER — ALBUTEROL SULFATE HFA 108 (90 BASE) MCG/ACT IN AERS: 1.0000 | INHALATION_SPRAY | Freq: Four times a day (QID) | RESPIRATORY_TRACT | Status: DC | PRN

## 2022-04-18 NOTE — Progress Notes (Signed)
   04/18/22 1205  Psych Admission Type (Psych Patients Only)  Admission Status Voluntary  Psychosocial Assessment  Patient Complaints Anxiety;Depression  Eye Contact Fair  Facial Expression Flat  Affect Appropriate to circumstance  Speech Logical/coherent  Interaction Assertive  Motor Activity Other (Comment)  Appearance/Hygiene Unremarkable  Behavior Characteristics Cooperative;Appropriate to situation  Mood Depressed  Thought Process  Coherency WDL  Content Blaming others  Delusions None reported or observed  Perception WDL  Hallucination None reported or observed  Judgment Poor  Confusion None  Danger to Self  Current suicidal ideation? Denies  Self-Injurious Behavior No self-injurious ideation or behavior indicators observed or expressed   Agreement Not to Harm Self Yes  Description of Agreement verbally contracts for safety  Danger to Others  Danger to Others None reported or observed
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
## 2022-04-18 NOTE — Plan of Care (Signed)
  Problem: Health Behavior/Discharge Planning: Goal: Compliance with treatment plan for underlying cause of condition will improve Outcome: Progressing   Problem: Safety: Goal: Periods of time without injury will increase Outcome: Progressing   

## 2022-04-18 NOTE — Group Note (Signed)
Occupational Therapy Group Note  Group Topic:Coping Skills  Group Date: 04/18/2022 Start Time: 1430 End Time: 1500 Facilitators: Ted Mcalpine, OT   Group Description: Group encouraged increased engagement and participation through discussion and activity focused on "Coping Ahead." Patients were split up into teams and selected a card from a stack of positive coping strategies. Patients were instructed to act out/charade the coping skill for other peers to guess and receive points for their team. Discussion followed with a focus on identifying additional positive coping strategies and patients shared how they were going to cope ahead over the weekend while continuing hospitalization stay.  Therapeutic Goal(s): Identify positive vs negative coping strategies. Identify coping skills to be used during hospitalization vs coping skills outside of hospital/at home Increase participation in therapeutic group environment and promote engagement in treatment   Participation Level: Engaged   Participation Quality: Independent   Behavior: Appropriate   Speech/Thought Process: Relevant   Affect/Mood: Appropriate   Insight: Fair   Judgement: Fair   Individualization: pt was engaged in their participation of group discussion/activity. New skills identified  Modes of Intervention: Education  Patient Response to Interventions:  Attentive   Plan: Continue to engage patient in OT groups 2 - 3x/week.  04/18/2022  Ted Mcalpine, OT Kerrin Champagne, OT

## 2022-04-18 NOTE — Group Note (Signed)
Recreation Therapy Group Note   Group Topic:Team Building  Group Date: 04/18/2022 Start Time: 0933 End Time: 1010 Facilitators: Ambry Dix-McCall, LRT,CTRS Location: [address] Dayroom   Goal Area(s) Addresses:  Patient will effectively work with peer towards shared goal.  Patient will identify skills used to make activity successful.  Patient will identify how skills used during activity can be used to reach post d/c goals.   Group Description: Straw Bridge. In teams of 3-5, patients were given 15 plastic drinking straws and an equal length of masking tape. Using the materials provided, patients were instructed to build a free standing bridge-like structure to suspend an everyday item (ex: puzzle box) off of the floor or table surface. All materials were required to be used by the team in their design. LRT facilitated post-activity discussion reviewing team process. Patients were encouraged to reflect how the skills used in this activity can be generalized to daily life post discharge.    Affect/Mood: N/A   Participation Level: Did not attend    Clinical Observations/Individualized Feedback:     Plan: Continue to engage patient in RT group sessions 2-3x/week.   Rue Valladares-McCall, LRT,CTRS 04/18/2022 12:59 PM

## 2022-04-18 NOTE — Progress Notes (Signed)
   04/18/22 0541  15 Minute Checks  Location Bedroom  Visual Appearance Calm  Behavior Sleeping  Sleep (Behavioral Health Patients Only)  Calculate sleep? (Click Yes once per 24 hr at 0600 safety check) Yes  Documented sleep last 24 hours 7

## 2022-04-18 NOTE — BHH Group Notes (Signed)
Adult Psychoeducational Group Note  Date:  04/18/2022 Time:  9:50 PM  Group Topic/Focus:  Wrap-Up Group:   The focus of this group is to help patients review their daily goal of treatment and discuss progress on daily workbooks.  Participation Level:  Active  Participation Quality:  Appropriate  Affect:  Appropriate  Cognitive:  Appropriate  Insight: Appropriate  Engagement in Group:  Engaged  Modes of Intervention:  Discussion and Support  Additional Comments:  Pt attended the evening group and responded to all discussion prompts from the Writer. Pt shared that today was a good day on the unit, the highlight of which was a supportive visit from her grandfather. On the subject of goals for the week, Pt mentioned hoping to discharge soon. ("I really miss my family.") Pt rated her day a 6 out of 10.  Christ Kick 04/18/2022, 9:50 PM

## 2022-04-18 NOTE — BH IP Treatment Plan (Signed)
Interdisciplinary Treatment and Diagnostic Plan Update  04/18/2022 Time of Session: 0830 Tiffany [name] MRN: [id number]  Principal Diagnosis: MDD (major depressive disorder), recurrent episode, severe  Secondary Diagnoses: Principal Problem:   MDD (major depressive disorder), recurrent episode, severe Active Problems:   GAD (generalized anxiety disorder)   PTSD (post-traumatic stress disorder)   Current Medications:  Current Facility-Administered Medications  Medication Dose Route Frequency Provider Last Rate Last Admin   acetaminophen (TYLENOL) tablet 650 mg  650 mg Oral Q6H PRN Ardis Hughs, NP       albuterol (VENTOLIN HFA) 108 (90 Base) MCG/ACT inhaler 1-2 puff  1-2 puff Inhalation Q6H PRN Karie Fetch, MD       alum & mag hydroxide-simeth (MAALOX/MYLANTA) 200-200-20 MG/5ML suspension 30 mL  30 mL Oral Q4H PRN Ardis Hughs, NP       diphenhydrAMINE (BENADRYL) capsule 50 mg  50 mg Oral TID PRN Ardis Hughs, NP       Or   diphenhydrAMINE (BENADRYL) injection 50 mg  50 mg Intramuscular TID PRN Ardis Hughs, NP       haloperidol (HALDOL) tablet 5 mg  5 mg Oral TID PRN Ardis Hughs, NP       Or   haloperidol lactate (HALDOL) injection 5 mg  5 mg Intramuscular TID PRN Ardis Hughs, NP       hydrOXYzine (ATARAX) tablet 25 mg  25 mg Oral TID PRN Ardis Hughs, NP       LORazepam (ATIVAN) tablet 2 mg  2 mg Oral TID PRN Ardis Hughs, NP       Or   LORazepam (ATIVAN) injection 2 mg  2 mg Intramuscular TID PRN Ardis Hughs, NP       magnesium hydroxide (MILK OF MAGNESIA) suspension 30 mL  30 mL Oral Daily PRN Ardis Hughs, NP       [START ON 04/19/2022] sertraline (ZOLOFT) tablet 175 mg  175 mg Oral Daily Karie Fetch, MD       traZODone (DESYREL) tablet 50 mg  50 mg Oral QHS PRN Ardis Hughs, NP       PTA Medications: Medications Prior to Admission  Medication Sig Dispense Refill Last Dose   sertraline (ZOLOFT) 50  MG tablet Take 3 tablets (150 mg total) by mouth daily.       Patient Stressors: Marital or family conflict   Traumatic event    Patient Strengths: Ability for insight  Active sense of humor  General fund of knowledge  Motivation for treatment/growth  Physical Health  Special hobby/interest  Supportive family/friends   Treatment Modalities: Medication Management, Group therapy, Case management,  1 to 1 session with clinician, Psychoeducation, Recreational therapy.   Physician Treatment Plan for Primary Diagnosis: MDD (major depressive disorder), recurrent episode, severe Long Term Goal(s): Improvement in symptoms so as ready for discharge   Short Term Goals: Ability to identify changes in lifestyle to reduce recurrence of condition will improve Ability to verbalize feelings will improve Ability to disclose and discuss suicidal ideas Ability to demonstrate self-control will improve Ability to identify and develop effective coping behaviors will improve Ability to maintain clinical measurements within normal limits will improve Compliance with prescribed medications will improve  Medication Management: Evaluate patient's response, side effects, and tolerance of medication regimen.  Therapeutic Interventions: 1 to 1 sessions, Unit Group sessions and Medication administration.  Evaluation of Outcomes: Progressing  Physician Treatment Plan for Secondary Diagnosis: Principal Problem:   MDD (  major depressive disorder), recurrent episode, severe Active Problems:   GAD (generalized anxiety disorder)   PTSD (post-traumatic stress disorder)  Long Term Goal(s): Improvement in symptoms so as ready for discharge   Short Term Goals: Ability to identify changes in lifestyle to reduce recurrence of condition will improve Ability to verbalize feelings will improve Ability to disclose and discuss suicidal ideas Ability to demonstrate self-control will improve Ability to identify and  develop effective coping behaviors will improve Ability to maintain clinical measurements within normal limits will improve Compliance with prescribed medications will improve     Medication Management: Evaluate patient's response, side effects, and tolerance of medication regimen.  Therapeutic Interventions: 1 to 1 sessions, Unit Group sessions and Medication administration.  Evaluation of Outcomes: Progressing   RN Treatment Plan for Primary Diagnosis: MDD (major depressive disorder), recurrent episode, severe Long Term Goal(s): Knowledge of disease and therapeutic regimen to maintain health will improve  Short Term Goals: Ability to remain free from injury will improve, Ability to verbalize frustration and anger appropriately will improve, Ability to demonstrate self-control, Ability to participate in decision making will improve, Ability to verbalize feelings will improve, Ability to disclose and discuss suicidal ideas, Ability to identify and develop effective coping behaviors will improve, and Compliance with prescribed medications will improve  Medication Management: RN will administer medications as ordered by provider, will assess and evaluate patient's response and provide education to patient for prescribed medication. RN will report any adverse and/or side effects to prescribing provider.  Therapeutic Interventions: 1 on 1 counseling sessions, Psychoeducation, Medication administration, Evaluate responses to treatment, Monitor vital signs and CBGs as ordered, Perform/monitor CIWA, COWS, AIMS and Fall Risk screenings as ordered, Perform wound care treatments as ordered.  Evaluation of Outcomes: Progressing   LCSW Treatment Plan for Primary Diagnosis: MDD (major depressive disorder), recurrent episode, severe Long Term Goal(s): Safe transition to appropriate next level of care at discharge, Engage patient in therapeutic group addressing interpersonal concerns.  Short Term Goals:  Engage patient in aftercare planning with referrals and resources, Increase social support, Increase ability to appropriately verbalize feelings, Increase emotional regulation, Facilitate acceptance of mental health diagnosis and concerns, Facilitate patient progression through stages of change regarding substance use diagnoses and concerns, Identify triggers associated with mental health/substance abuse issues, and Increase skills for wellness and recovery  Therapeutic Interventions: Assess for all discharge needs, 1 to 1 time with Social worker, Explore available resources and support systems, Assess for adequacy in community support network, Educate family and significant other(s) on suicide prevention, Complete Psychosocial Assessment, Interpersonal group therapy.  Evaluation of Outcomes: Progressing   Progress in Treatment: Attending groups: Yes. Participating in groups: Yes. Taking medication as prescribed: Yes. Toleration medication: Yes. Family/Significant other contact made: No, will contact:  CSW to obtain consent  Patient understands diagnosis: Yes. Discussing patient identified problems/goals with staff: Yes. Medical problems stabilized or resolved: Yes. Denies suicidal/homicidal ideation: No. Issues/concerns per patient self-inventory: Yes. Other: none  New problem(s) identified: No, Describe:  none  New Short Term/Long Term Goal(s): Patient to work towards medication management for mood stabilization; elimination of SI thoughts; development of comprehensive mental wellness plan.  Patient Goals:  Patient states their goal for treatment is to "Coping skills."   Discharge Plan or Barriers: No psychosocial barriers identified at this time, patient to return to place of residence when appropriate for discharge.   Reason for Continuation of Hospitalization: Depression Medication stabilization  Estimated Length of Stay: 1-7 days   Last 3 [name]  Suicide Severity Risk  Score: Flowsheet Row [facility] from [facility]16/2022 in [facility] Counselor from 04/09/2021 in [facility]  C-SSRS RISK CATEGORY Low Risk No Risk No Risk       Last [facility] 2/9 Scores:    04/17/2022   12:14 AM 03/29/2022   11:27 AM 10/26/2021    9:31 AM  Depression screen PHQ 2/9  Decreased Interest 1 2 1   Down, Depressed, Hopeless 1 2 1   PHQ - 2 Score 2 4 2   Altered sleeping 0 2 2  Tired, decreased energy 1 2 1   Change in appetite 0 2 0  Feeling bad or failure about yourself  1 1 1   Trouble concentrating 1 1 1   Moving slowly or fidgety/restless 1 0 1  Suicidal thoughts 1 0 0  PHQ-9 Score 7 12 8   Difficult doing work/chores Somewhat difficult Not difficult at all Somewhat difficult    Scribe for Treatment Team: Almedia Balls 04/18/2022 3:28 PM
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
## 2022-04-18 NOTE — Progress Notes (Signed)
Tiffany [name], Chaplain  Greg Cutter, Chaplain Spiritual care group on grief and loss facilitated by [employer], [facility] and Chaplain Cally Nygard   Group Goal: Support / Education around grief and loss   Members engage in facilitated group support and psycho-social education.   Group Description:   Following introductions and group rules, group members engaged in facilitated group dialogue and support around topic of loss, with particular support around experiences of loss in their lives. Group Identified types of loss (relationships / self / things) and identified patterns, circumstances, and changes that precipitate losses. Reflected on thoughts / feelings around loss, normalized grief responses, and recognized variety in grief experience. Group encouraged individual reflection on safe space and on the coping skills that they are already utilizing.   Group drew on Adlerian / Rogerian and narrative framework   Patient Progress: Patient attended group. Patient did not participate in group discussions but engaged with worksheets.     Tiffany [name] [employer]

## 2022-04-18 NOTE — Progress Notes (Signed)
   04/18/22 2045  Psych Admission Type (Psych Patients Only)  Admission Status Voluntary  Psychosocial Assessment  Patient Complaints Anxiety  Eye Contact Fair  Facial Expression Flat  Affect Appropriate to circumstance  Speech Logical/coherent  Interaction Assertive  Motor Activity Other (Comment)  Appearance/Hygiene Unremarkable  Behavior Characteristics Cooperative  Mood Depressed  Aggressive Behavior  Effect No apparent injury  Thought Process  Coherency WDL  Content Blaming others  Delusions None reported or observed  Perception WDL  Hallucination None reported or observed  Judgment Poor  Confusion None  Danger to Self  Current suicidal ideation? Denies  Danger to Others  Danger to Others None reported or observed

## 2022-04-19 DIAGNOSIS — F332 Major depressive disorder, recurrent severe without psychotic features: Secondary | ICD-10-CM | POA: Diagnosis not present

## 2022-04-19 LAB — TSH: TSH: 3.54 u[IU]/mL (ref 0.350–4.500)

## 2022-04-19 MED ORDER — SERTRALINE HCL 100 MG PO TABS
200.0000 mg | ORAL_TABLET | Freq: Every day | ORAL | Status: DC
  Administered 2022-04-20 – 2022-04-21 (×2): 200 mg via ORAL
  Filled 2022-04-19 (×3): qty 2

## 2022-04-19 NOTE — Progress Notes (Signed)
   04/19/22 2045  Psych Admission Type (Psych Patients Only)  Admission Status Voluntary  Psychosocial Assessment  Patient Complaints Anxiety  Eye Contact Fair  Facial Expression Flat  Affect Appropriate to circumstance  Speech Logical/coherent  Interaction Assertive  Motor Activity Other (Comment)  Appearance/Hygiene Unremarkable  Behavior Characteristics Cooperative  Mood Depressed  Aggressive Behavior  Effect No apparent injury  Thought Process  Coherency WDL  Content Blaming others  Delusions None reported or observed  Perception WDL  Hallucination None reported or observed  Judgment Poor  Confusion None  Danger to Self  Current suicidal ideation? Denies  Danger to Others  Danger to Others None reported or observed

## 2022-04-19 NOTE — Progress Notes (Signed)
D: Patient is alert, oriented, pleasant, and cooperative. Denies SI, HI, AVH, and verbally contracts for safety. Patient reports she slept good last night with sleeping medication. Patient reports her appetite as fair, energy level as normal, and concentration as good. Patient rates her depression 2/10, hopelessness 0/10, and anxiety 1/10. Patient denies physical symptoms/pain.    A: Scheduled medications administered per MD order. Support provided. Patient educated on safety on the unit and medications. Routine safety checks every 15 minutes. Patient stated understanding to tell nurse about any new physical symptoms. Patient understands to tell staff of any needs.     R: No adverse drug reactions noted. Patient verbally contracts for safety. Patient remains safe at this time and will continue to monitor.    04/19/22 1000  Psych Admission Type (Psych Patients Only)  Admission Status Voluntary  Psychosocial Assessment  Patient Complaints Anxiety  Eye Contact Fair  Facial Expression Flat  Affect Appropriate to circumstance  Speech Logical/coherent  Interaction Assertive  Motor Activity Other (Comment) (WNL)  Appearance/Hygiene Unremarkable  Behavior Characteristics Cooperative  Mood Depressed  Thought Process  Coherency WDL  Content WDL  Delusions None reported or observed  Perception WDL  Hallucination None reported or observed  Judgment Poor  Confusion None  Danger to Self  Current suicidal ideation? Denies  Agreement Not to Harm Self Yes  Description of Agreement verbal  Danger to Others  Danger to Others None reported or observed

## 2022-04-19 NOTE — Group Note (Signed)
Recreation Therapy Group Note   Group Topic:Animal Assisted Therapy   Group Date: 04/19/2022 Start Time: 0950 End Time: 1030 Facilitators: Neo Yepiz-McCall, LRT,CTRS Location: [address] Dayroom   Animal-Assisted Activity (AAA) Program Checklist/Progress Notes Patient Eligibility Criteria Checklist & Daily Group note for Rec Tx Intervention  AAA/T Program Assumption of Risk Form signed by Patient/ or Parent Legal Guardian Yes  Patient is free of allergies or severe asthma Yes  Patient reports no fear of animals Yes  Patient reports no history of cruelty to animals Yes  Patient understands his/her participation is voluntary Yes  Patient washes hands before animal contact Yes  Patient washes hands after animal contact Yes   Affect/Mood: Appropriate   Participation Level: Engaged   Participation Quality: Independent   Behavior: Appropriate    Clinical Observations/Individualized Feedback:  Patient attended session and interacted appropriately with therapy dog and peers. Patient asked appropriate questions about therapy dog and his training. Patient shared stories about their pets at home with group.    Plan: Continue to engage patient in RT group sessions 2-3x/week.   Tamyah Cutbirth-McCall, LRT,CTRS 04/19/2022 12:51 PM
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
## 2022-04-19 NOTE — Group Note (Signed)
Date:  04/19/2022 Time:  12:29 PM  Group Topic/Focus:  Making Healthy Choices:   The focus of this group is to help patients identify negative/unhealthy choices they were using prior to admission and identify positive/healthier coping strategies to replace them upon discharge. Personal Choices and Values:   The focus of this group is to help patients assess and explore the importance of values in their lives, how their values affect their decisions, how they express their values and what opposes their expression.    Participation Level:  Active  Participation Quality:  Appropriate  Affect:  Appropriate  Cognitive:  Appropriate  Insight: Appropriate  Engagement in Group:  Engaged  Modes of Intervention:  Discussion, Rapport Building, and Support  Additional Comments:    Katey Barrie M Debany Vantol 04/19/2022, 12:29 PM  
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
## 2022-04-19 NOTE — Group Note (Signed)
Date:  04/19/2022 Time:  11:22 AM  Group Topic/Focus:  Goals Group:   The focus of this group is to help patients establish daily goals to achieve during treatment and discuss how the patient can incorporate goal setting into their daily lives to aide in recovery. Orientation:   The focus of this group is to educate the patient on the purpose and policies of crisis stabilization and provide a format to answer questions about their admission.  The group details unit policies and expectations of patients while admitted.    Participation Level:  Active  Participation Quality:  Appropriate  Affect:  Appropriate  Cognitive:  Appropriate  Insight: Appropriate  Engagement in Group:  Engaged  Modes of Intervention:  Discussion, Orientation, and Rapport Building  Additional Comments:   Pt attended and actively participated in the Orientation/Goals group. Pt personal goal is to "get better enough to go home to family".  Tiffany [name] 04/19/2022, 11:22 AM

## 2022-04-19 NOTE — [facility] Group Notes (Signed)
Adult Psychoeducational Group Note  Date:  04/19/2022 Time:  10:31 PM  Group Topic/Focus:  Wrap-Up Group:   The focus of this group is to help patients review their daily goal of treatment and discuss progress on daily workbooks.  Participation Level:  Active  Participation Quality:  Appropriate  Affect:  Appropriate  Cognitive:  Appropriate  Insight: Appropriate  Engagement in Group:  Supportive  Modes of Intervention:  Discussion  Additional Comments:    Tiffany [name] [name] 04/19/2022, 10:31 PM 

## 2022-04-20 DIAGNOSIS — F332 Major depressive disorder, recurrent severe without psychotic features: Secondary | ICD-10-CM | POA: Diagnosis not present

## 2022-04-20 NOTE — Progress Notes (Signed)
   04/20/22 1000  Psych Admission Type (Psych Patients Only)  Admission Status Voluntary  Psychosocial Assessment  Patient Complaints Anxiety  Eye Contact Fair  Facial Expression Flat  Affect Appropriate to circumstance  Speech Logical/coherent  Interaction Assertive  Motor Activity Other (Comment) (wdl)  Appearance/Hygiene Unremarkable  Behavior Characteristics Cooperative  Mood Depressed  Thought Process  Coherency WDL  Content WDL  Delusions None reported or observed  Perception WDL  Hallucination None reported or observed  Judgment Poor  Confusion None  Danger to Self  Current suicidal ideation? Denies  Agreement Not to Harm Self Yes  Description of Agreement verbal  Danger to Others  Danger to Others None reported or observed

## 2022-04-20 NOTE — Progress Notes (Signed)
   04/20/22 2000  Psych Admission Type (Psych Patients Only)  Admission Status Voluntary  Psychosocial Assessment  Patient Complaints Anxiety  Eye Contact Fair  Facial Expression Flat  Affect Appropriate to circumstance  Speech Logical/coherent  Interaction Assertive  Motor Activity Other (Comment)  Appearance/Hygiene Unremarkable  Behavior Characteristics Cooperative  Mood Anxious;Pleasant  Aggressive Behavior  Effect No apparent injury  Thought Process  Coherency WDL  Content Blaming others  Delusions None reported or observed  Perception WDL  Hallucination None reported or observed  Judgment Poor  Confusion None  Danger to Self  Current suicidal ideation? Denies  Danger to Others  Danger to Others None reported or observed
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
## 2022-04-20 NOTE — [facility] Group Notes (Signed)
Pt did not attend NA group 

## 2022-04-20 NOTE — Group Note (Signed)
Date:  04/20/2022 Time:  10:10 AM  Group Topic/Focus:  Goals Group:   The focus of this group is to help patients establish daily goals to achieve during treatment and discuss how the patient can incorporate goal setting into their daily lives to aide in recovery. Orientation:   The focus of this group is to educate the patient on the purpose and policies of crisis stabilization and provide a format to answer questions about their admission.  The group details unit policies and expectations of patients while admitted.    Participation Level:  Active  Tiffany [name] 04/20/2022, 10:10 AM

## 2022-04-20 NOTE — Group Note (Signed)
Recreation Therapy Group Note   Group Topic:Health and Wellness  Group Date: 04/20/2022 Start Time: 0935 End Time: 1009 Facilitators: Cincere Deprey-McCall, LRT,CTRS Location: [address] Dayroom   Goal Area(s) Addresses:   Patient will verbalize benefit of exercise during group session. Patient will verbalize an exercise that can be completed in their hospital room during admission. Patient will identify an exercise that can be completed post d/c.   Group Description:  Exercise.  LRT and patients took turns leading the group in various exercises.  Patients were to complete the exercises based on their physical abilities.  Patients could take breaks as needed and get water if needed.   Affect/Mood: Appropriate   Participation Level: Engaged   Participation Quality: Independent   Behavior: Appropriate   Speech/Thought Process: Focused   Insight: Good   Judgement: Good   Modes of Intervention: Music and Exercise   Patient Response to Interventions:  Engaged   Education Outcome:  In group clarification offered    Clinical Observations/Individualized Feedback: Pt attended and participated in group session.    Plan: Continue to engage patient in RT group sessions 2-3x/week.   Gabriella Woodhead-McCall, LRT,CTRS 04/20/2022 12:15 PM

## 2022-04-21 DIAGNOSIS — F332 Major depressive disorder, recurrent severe without psychotic features: Secondary | ICD-10-CM | POA: Diagnosis not present

## 2022-04-21 MED ORDER — HYDROXYZINE HCL 25 MG PO TABS: 25.0000 mg | ORAL_TABLET | Freq: Three times a day (TID) | ORAL | 0 refills | Status: DC | PRN

## 2022-04-21 MED ORDER — SERTRALINE HCL 100 MG PO TABS: 200.0000 mg | ORAL_TABLET | Freq: Every day | ORAL | 0 refills | Status: DC

## 2022-04-21 MED ORDER — ALBUTEROL SULFATE HFA 108 (90 BASE) MCG/ACT IN AERS: 1.0000 | INHALATION_SPRAY | Freq: Four times a day (QID) | RESPIRATORY_TRACT | Status: DC | PRN

## 2022-04-21 NOTE — Discharge Instructions (Signed)
-  Follow-up with your outpatient psychiatric provider -instructions on appointment date, time, and address (location) are provided to you in discharge paperwork.  -Take your psychiatric medications as prescribed at discharge - instructions are provided to you in the discharge paperwork  -Follow-up with outpatient primary care doctor and other specialists -for management of preventative medicine and any chronic medical disease.  -Recommend abstinence from alcohol, tobacco, and other illicit drug use at discharge.   -If your psychiatric symptoms recur, worsen, or if you have side effects to your psychiatric medications, call your outpatient psychiatric provider, 911, 988 or go to the nearest emergency department.  -If suicidal thoughts occur, call your outpatient psychiatric provider, 911, 988 or go to the nearest emergency department.  Naloxone (Narcan) can help reverse an overdose when given to the victim quickly.  [place] offers free naloxone kits and instructions/training on its use.  Add naloxone to your first aid kit and you can help save a life.   Pick up your free kit at the following locations:   [place]:  [facility], [address] [place] [address] ([phone]) [facility] [address] [place] [address] ([phone]) [facility] [address] [place] [address]  [place]: [facility] [address] [place] [address] ([phone]) [facility] [address] [place] [address] ([phone])  

## 2022-04-21 NOTE — Progress Notes (Signed)
  Tiffany [name] Adult Case Management Discharge Plan :  Will you be returning to the same living situation after discharge:  Yes,  Patient will be returning back home with her mom  At discharge, do you have transportation home?: Yes,  Patient mom will pick her up at 11:45 AM  Do you have the ability to pay for your medications: Yes,  Patient has Medicaid [employer] plan   Release of information consent forms completed and in the chart;  Patient's signature needed at discharge.  Patient to Follow up at:  Follow-up Information     [facility]. Go to.   Specialty: Urgent Care Why: You have a follow up appointment with your Therapist Tiffany [name] on May 8th, 2024 , in person at [facility] and Med Mgmt with Dr. Morrie [name] on 05/18/22 @ 10:30 Contact information: [address] [place] [address] [phone]                Next level of care provider has access to [facility] Link:yes  Safety Planning and Suicide Prevention discussed: Yes,  Tiffany [name] ( mom ) [phone]     Has patient been referred to the Quitline?: N/A patient is not a smoker  Patient has been referred for addiction treatment: N/A. Patient does not have an addiction to need an referral   Tiffany [name] 04/21/2022, 9:37 AM

## 2022-04-21 NOTE — Group Note (Signed)
Date:  04/21/2022 Time:  10:14 AM  Group Topic/Focus:  Orientation:   The focus of this group is to educate the patient on the purpose and policies of crisis stabilization and provide a format to answer questions about their admission.  The group details unit policies and expectations of patients while admitted.    Participation Level:  Active  Participation Quality:  Appropriate  Affect:  Appropriate  Cognitive:  Appropriate  Insight: Appropriate  Engagement in Group:  Engaged  Modes of Intervention:  Discussion  Additional Comments:     Reymundo Poll 04/21/2022, 10:14 AM

## 2022-04-21 NOTE — [facility] Suicide Risk Assessment (Signed)
[facility] INPATIENT:  Family/Significant Other Suicide Prevention Education  Suicide Prevention Education:  Education Completed; Tiffany [name] ( mom ) [phone],  (name of family member/significant other) has been identified by the patient as the family member/significant other with whom the patient will be residing, and identified as the person(s) who will aid the patient in the event of a mental health crisis (suicidal ideations/suicide attempt).  With written consent from the patient, the family member/significant other has been provided the following suicide prevention education, prior to the and/or following the discharge of the patient.  CSW spoke with patient mom and completed safety planning. Mom stated that she did not have any safety concerns for patient and that she would pick patient up. Mom also stated that all guns and weapons have been locked up and secured.    The suicide prevention education provided includes the following: Suicide risk factors Suicide prevention and interventions National Suicide Hotline telephone number [facility] assessment telephone number [facility] Emergency Assistance 911 [facility] and/or Residential Mobile Crisis Unit telephone number  Request made of family/significant other to: Remove weapons (e.g., guns, rifles, knives), all items previously/currently identified as safety concern.   Remove drugs/medications (over-the-counter, prescriptions, illicit drugs), all items previously/currently identified as a safety concern.  The family member/significant other verbalizes understanding of the suicide prevention education information provided.  The family member/significant other agrees to remove the items of safety concern listed above.  Tiffany [name] 04/21/2022, 9:24 AM

## 2022-04-21 NOTE — [facility] Suicide Risk Assessment (Signed)
[facility] Discharge Suicide Risk Assessment   Principal Problem: MDD (major depressive disorder), recurrent episode, severe Discharge Diagnoses: Principal Problem:   MDD (major depressive disorder), recurrent episode, severe Active Problems:   GAD (generalized anxiety disorder)   PTSD (post-traumatic stress disorder)   Total Time spent with patient: 15 minutes  Tiffany [name] is a 22 y.o., female with a past psychiatric history significant for MDD, ADHD, PTSD, GAD who presents to the [facility] voluntarily from home for evaluation and management of worsening depression and suicidal ideation.    Psychiatric diagnoses provided upon initial assessment:  #MDD #PTSD #GAD   Patient's psychiatric medications were adjusted on admission:  -incr zoloft to 175 mg once daily   During the hospitalization, other adjustments were made to the patient's psychiatric medication regimen:  -incr zolof to 200 mg once daily     Psychiatric Specialty Exam  Presentation  General Appearance:  Appropriate for Environment; Casual; Fairly Groomed  Eye Contact: Good  Speech: Normal Rate; Clear and Coherent  Speech Volume: Normal  Handedness: Right   Mood and Affect  Mood: Euthymic  Duration of Depression Symptoms: Greater than two weeks  Affect: Appropriate; Congruent; Full Range   Thought Process  Thought Processes: Linear  Descriptions of Associations:Intact  Orientation:Full (Time, Place and Person)  Thought Content:Logical  History of Schizophrenia/Schizoaffective disorder:No  Duration of Psychotic Symptoms:No data recorded Hallucinations:Hallucinations: None  Ideas of Reference:None  Suicidal Thoughts:Suicidal Thoughts: No  Homicidal Thoughts:Homicidal Thoughts: No   Sensorium  Memory: Immediate Good; Recent Good; Remote Good  Judgment: Good  Insight: Good   Executive Functions  Concentration: Good  Attention  Span: Good  Recall: Good  Fund of Knowledge: Good  Language: Good   Psychomotor Activity  Psychomotor Activity: Psychomotor Activity: Normal   Assets  Assets: Communication Skills; Desire for Improvement; Financial Resources/Insurance; Physical Health; Resilience; Leisure Time   Sleep  Sleep: Sleep: Fair   Physical Exam: Physical Exam See discharge summary  ROS See discharge summary  Blood pressure 120/81, pulse 75, temperature 98 F (36.7 C), temperature source Oral, resp. rate 16, height  (1.575 m), weight 90.7 kg, SpO2 100 %. Body mass index is 36.58 kg/m.  Mental Status Per Nursing Assessment::   On Admission:  Suicide plan, Suicidal ideation indicated by patient  Demographic factors:  Adolescent or young adult Loss Factors:  Loss of significant relationship Historical Factors:  Prior suicide attempts Risk Reduction Factors:  Positive therapeutic relationship, Positive social support, Living with another person, especially a relative  Continued Clinical Symptoms:  Mood is stable. Denying SI.   Cognitive Features That Contribute To Risk:  None    Suicide Risk:  Mild:  There are no identifiable suicide plans, no associated intent, mild dysphoria and related symptoms, good self-control (both objective and subjective assessment), few other risk factors, and identifiable protective factors, including available and accessible social support.    Follow-up Information     [facility]. Go to.   Specialty: Urgent Care Why: You have a follow up appointment with your Therapist Paige Cozart on May 8th, 2024 , in person at [facility] and Med Mgmt with Dr. Morrie Sheldon on 05/18/22 @ 10:30 Contact information: [address] [place] [address] [phone]                Plan Of Care/Follow-up recommendations:    Activity: as tolerated   Diet: heart healthy   Other: -Follow-up with your outpatient psychiatric  provider -instructions on appointment  date, time, and address (location) are provided to you in discharge paperwork.   -Take your psychiatric medications as prescribed at discharge - instructions are provided to you in the discharge paperwork   -Follow-up with outpatient primary care doctor and other specialists -for management of preventative medicine and chronic medical disease.   -Recommend abstinence from alcohol, tobacco, and other illicit drug use at discharge.    -If your psychiatric symptoms recur, worsen, or if you have side effects to your psychiatric medications, call your outpatient psychiatric provider, 911, 988 or go to the nearest emergency department.   -If suicidal thoughts recur, call your outpatient psychiatric provider, 911, 988 or go to the nearest emergency department.    Cristy Hilts, MD 04/21/2022, 9:44 AM

## 2022-04-21 NOTE — Discharge Summary (Signed)
Physician Discharge Summary Note  Patient:  Tiffany [name] is an 22 y.o., female MRN:  [id number] DOB:  03/30/2000 Patient phone:  [phone] (home)  Patient address:   [address] [place] [address],  Total Time spent with patient: 15 minutes  Date of Admission:  04/17/2022 Date of Discharge: 04-21-2022  Reason for Admission:   Tiffany [name] is a 22 y.o., female with a past psychiatric history significant for MDD, ADHD, PTSD, GAD who presents to the [facility] voluntarily from home for evaluation and management of worsening depression and suicidal ideation.   Principal Problem: MDD (major depressive disorder), recurrent episode, severe Discharge Diagnoses: Principal Problem:   MDD (major depressive disorder), recurrent episode, severe Active Problems:   GAD (generalized anxiety disorder)   PTSD (post-traumatic stress disorder)   Past Psychiatric History:  Previous Psych Diagnoses: MDD, ADHD, dyslexia, PTSD, anxiety  Prior inpatient treatment: Current/prior outpatient treatment: Sees Dr. Eliseo [name] at [facility]. Tiffany Cozart, LCSW at [facility] [facility] Prior rehab hx: Denies Psychotherapy hx: Reports has seen other therapist before, but feels like she really connects with current therapist History of suicide: few months ago, attempted to overdose on pills. History of cutting, last self-harm was years ago History of homicide: Denies Psychiatric medication history: zoloft 150, trazodone, Adderall Psychiatric medication compliance history:non-compliant, will forget 2 days/week Neuromodulation history: Denies Current Psychiatrist: Dr. Eliseo [name]  Current therapist: Deloris [name]    Past Medical History:  Past Medical History:  Diagnosis Date   ADHD (attention deficit hyperactivity disorder) 2004   Took Adderall for several years, then Dexedrine, now Strattera   Asthma "since infancy"   triggers more in winter, has had bronchitis/pneumonia   Dyslexia    Mood disorder  2013    Past Surgical History:  Procedure Laterality Date   ADENOIDECTOMY     OTHER SURGICAL HISTORY     tubes in the ears   TONSILLECTOMY     TYMPANOSTOMY TUBE PLACEMENT     Family History:  Family History  Problem Relation Age of Onset   Learning disabilities Mother    Mental illness Mother    Drug abuse Mother    Learning disabilities Brother    Diabetes Maternal Uncle    Heart disease Maternal Grandmother    Hypertension Maternal Grandmother    Learning disabilities Maternal Grandmother    Depression Maternal Grandmother    Anxiety disorder Maternal Grandmother    Learning disabilities Maternal Grandfather    Heart murmur Maternal Grandfather    Alcohol abuse Neg Hx    Clotting disorder Neg Hx    Mental illness Father    Heart disease Father    Family Psychiatric  History:  Medical: Reports grandma recently found cancerous polyps in her colon, history of heart problems.  Reports great grandma with seizures.  Reports family history of dementia Psych:  Mother dyslexia, eating disorder, substance use, anxiety  Brother dyslexia and bipolar, Maternal grandmother dyslexia, depression, anxiety  Psych Rx: Unknown SA/HA: Reports uncle committed suicide Substance use family hx: Reports family history of substance use  Social History:  Social History   Substance and Sexual Activity  Alcohol Use Never     Social History   Substance and Sexual Activity  Drug Use Never    Social History   Socioeconomic History   Marital status: Single    Spouse name: Not on file   Number of children: Not on file   Years of education: Not on file   Highest education level:  Not on file  Occupational History   Not on file  Tobacco Use   Smoking status: Never   Smokeless tobacco: Never   Tobacco comments:    mom quit!  Substance and Sexual Activity   Alcohol use: Never   Drug use: Never   Sexual activity: Not Currently  Other Topics Concern   Not on file  Social History  Narrative   Not on file   Social Determinants of Health   Financial Resource Strain: Not on file  Food Insecurity: No Food Insecurity (04/17/2022)   Hunger Vital Sign    Worried About Running Out of Food in the Last Year: Never true    Ran Out of Food in the Last Year: Never true  Transportation Needs: No Transportation Needs (04/17/2022)   PRAPARE - Administrator, Civil Service (Medical): No    Lack of Transportation (Non-Medical): No  Physical Activity: Not on file  Stress: Not on file  Social Connections: Not on file    Hospital Course:   During the patient's hospitalization, patient had extensive initial psychiatric evaluation, and follow-up psychiatric evaluations every day.  Psychiatric diagnoses provided upon initial assessment:  #MDD #PTSD #GAD  Patient's psychiatric medications were adjusted on admission:  -incr zoloft to 175 mg once daily  During the hospitalization, other adjustments were made to the patient's psychiatric medication regimen:  -incr zolof to 200 mg once daily   Patient's care was discussed during the interdisciplinary team meeting every day during the hospitalization.  The patient denied having side effects to prescribed psychiatric medication.  Gradually, patient started adjusting to milieu. The patient was evaluated each day by a clinical provider to ascertain response to treatment. Improvement was noted by the patient's report of decreasing symptoms, improved sleep and appetite, affect, medication tolerance, behavior, and participation in unit programming.  Patient was asked each day to complete a self inventory noting mood, mental status, pain, new symptoms, anxiety and concerns.    Symptoms were reported as significantly decreased or resolved completely by discharge.   On day of discharge, the patient reports that their mood is stable. The patient denied having suicidal thoughts for more than 48 hours prior to discharge.  Patient  denies having homicidal thoughts.  Patient denies having auditory hallucinations.  Patient denies any visual hallucinations or other symptoms of psychosis. The patient was motivated to continue taking medication with a goal of continued improvement in mental health.   The patient reports their target psychiatric symptoms of depression and suicidal thoughts, all responded well to the psychiatric medications, and the patient reports overall benefit other psychiatric hospitalization. Supportive psychotherapy was provided to the patient. The patient also participated in regular group therapy while hospitalized. Coping skills, problem solving as well as relaxation therapies were also part of the unit programming.  Labs were reviewed with the patient, and abnormal results were discussed with the patient.  The patient is able to verbalize their individual safety plan to this provider.  # It is recommended to the patient to continue psychiatric medications as prescribed, after discharge from the hospital.    # It is recommended to the patient to follow up with your outpatient psychiatric provider and PCP.  # It was discussed with the patient, the impact of alcohol, drugs, tobacco have been there overall psychiatric and medical wellbeing, and total abstinence from substance use was recommended the patient.ed.  # Prescriptions provided or sent directly to preferred pharmacy at discharge. Patient agreeable to plan. Given  opportunity to ask questions. Appears to feel comfortable with discharge.    # In the event of worsening symptoms, the patient is instructed to call the crisis hotline, 911 and or go to the nearest ED for appropriate evaluation and treatment of symptoms. To follow-up with primary care provider for other medical issues, concerns and or health care needs  # Patient was discharged home, with a plan to follow up as noted below.   Physical Findings: AIMS:  , ,  ,  ,    CIWA:    COWS:      Musculoskeletal: Strength & Muscle Tone: within normal limits Gait & Station: normal Patient leans: N/A   Psychiatric Specialty Exam:  Presentation  General Appearance:  Appropriate for Environment; Casual; Fairly Groomed  Eye Contact: Good  Speech: Normal Rate; Clear and Coherent  Speech Volume: Normal  Handedness: Right   Mood and Affect  Mood: Euthymic  Affect: Appropriate; Congruent; Full Range   Thought Process  Thought Processes: Linear  Descriptions of Associations:Intact  Orientation:Full (Time, Place and Person)  Thought Content:Logical  History of Schizophrenia/Schizoaffective disorder:No  Duration of Psychotic Symptoms:No data recorded Hallucinations:Hallucinations: None  Ideas of Reference:None  Suicidal Thoughts:Suicidal Thoughts: No  Homicidal Thoughts:Homicidal Thoughts: No   Sensorium  Memory: Immediate Good; Recent Good; Remote Good  Judgment: Good  Insight: Good   Executive Functions  Concentration: Good  Attention Span: Good  Recall: Good  Fund of Knowledge: Good  Language: Good   Psychomotor Activity  Psychomotor Activity: Psychomotor Activity: Normal   Assets  Assets: Communication Skills; Desire for Improvement; Financial Resources/Insurance; Physical Health; Resilience; Leisure Time   Sleep  Sleep: Sleep: Fair    Physical Exam: Physical Exam Vitals reviewed.  Constitutional:      Appearance: She is normal weight.  Pulmonary:     Effort: Pulmonary effort is normal.  Neurological:     Mental Status: She is alert.     Motor: No weakness.     Gait: Gait normal.  Psychiatric:        Mood and Affect: Mood normal.        Behavior: Behavior normal.        Thought Content: Thought content normal.        Judgment: Judgment normal.    Review of Systems  Constitutional:  Negative for chills and fever.  Cardiovascular:  Negative for chest pain and palpitations.  Neurological:   Negative for dizziness, tingling, tremors and headaches.  Psychiatric/Behavioral:  Negative for depression, hallucinations, memory loss, substance abuse and suicidal ideas. The patient is not nervous/anxious and does not have insomnia.   All other systems reviewed and are negative.  Blood pressure 120/81, pulse 75, temperature 98 F (36.7 C), temperature source Oral, resp. rate 16, height 5\' 2"  (1.575 m), weight 90.7 kg, SpO2 100 %. Body mass index is 36.58 kg/m.   Social History   Tobacco Use  Smoking Status Never  Smokeless Tobacco Never  Tobacco Comments   mom quit!   Tobacco Cessation:  N/A, patient does not currently use tobacco products   Blood Alcohol level:  Lab Results  Component Value Date   ETH <10 04/17/2022    Metabolic Disorder Labs:  No results found for: "HGBA1C", "MPG" No results found for: "PROLACTIN" No results found for: "CHOL", "TRIG", "HDL", "CHOLHDL", "VLDL", "LDLCALC"  See Psychiatric Specialty Exam and Suicide Risk Assessment completed by Attending Physician prior to discharge.  Discharge destination:  Home  Is patient on multiple antipsychotic therapies at  discharge:  No   Has Patient had three or more failed trials of antipsychotic monotherapy by history:  No  Recommended Plan for Multiple Antipsychotic Therapies: NA  Discharge Instructions     Diet - low sodium heart healthy   Complete by: As directed    Increase activity slowly   Complete by: As directed       Allergies as of 04/21/2022       Reactions   Augmentin [amoxicillin-pot Clavulanate] Hives, Other (See Comments)       Bee Pollen    Latex    Penicillins Rash        Medication List     TAKE these medications      Indication  albuterol 108 (90 Base) MCG/ACT inhaler Commonly known as: VENTOLIN HFA Inhale 1-2 puffs into the lungs every 6 (six) hours as needed for wheezing or shortness of breath.  Indication: Asthma   hydrOXYzine 25 MG tablet Commonly known as:  ATARAX Take 1 tablet (25 mg total) by mouth 3 (three) times daily as needed for anxiety.  Indication: Feeling Anxious   sertraline 100 MG tablet Commonly known as: ZOLOFT Take 2 tablets (200 mg total) by mouth daily. Start taking on: April 22, 2022 What changed:  medication strength how much to take  Indication: Generalized Anxiety Disorder, Major Depressive Disorder        Follow-up Information     [facility]. Go to.   Specialty: Urgent Care Why: You have a follow up appointment with your Therapist Tiffany [name] on May 8th, 2024 @2p , in person at [facility] with Dr. Morrie Sheldon on 05/18/22 @ 10:30 Contact information: [address] [place] [address] [phone]                Follow-up recommendations:    Activity: as tolerated  Diet: heart healthy  Other: -Follow-up with your outpatient psychiatric provider -instructions on appointment date, time, and address (location) are provided to you in discharge paperwork.  -Take your psychiatric medications as prescribed at discharge - instructions are provided to you in the discharge paperwork  -Follow-up with outpatient primary care doctor and other specialists -for management of preventative medicine and chronic medical disease.  -Recommend abstinence from alcohol, tobacco, and other illicit drug use at discharge.   -If your psychiatric symptoms recur, worsen, or if you have side effects to your psychiatric medications, call your outpatient psychiatric provider, 911, [phone] or go to the nearest emergency department.  -If suicidal thoughts recur, call your outpatient psychiatric provider, 911, [phone] or go to the nearest emergency department.   Signed: Cristy Hilts, MD 04/21/2022, 9:39 AM  Total Time Spent in Direct Patient Care:  I personally spent 35 minutes on the unit in direct patient care. The direct patient care time included face-to-face time with the  patient, reviewing the patient's chart, communicating with other professionals, and coordinating care. Greater than 50% of this time was spent in counseling or coordinating care with the patient regarding goals of hospitalization, psycho-education, and discharge planning needs.   Phineas Inches, MD Psychiatrist

## 2022-04-21 NOTE — Progress Notes (Signed)
Patient is discharging at this time. Patient is A&Ox4. Vs stable. Patient denies SI,HI, and A/V/H with no plan/intent. Printed AVS reviewed with and given to patient along with medications and follow up appointments. Suicide safety plan sheet complete with copy provided to patient. Original form placed in chart. Patient verbalized all understanding. All valuables/belongings returned to patient. Patient is being transported by her mother. Patient denies any pain/discomfort. No s/s of current distress.

## 2022-04-21 NOTE — Group Note (Signed)
Date:  04/21/2022 Time:  10:17 AM  Group Topic/Focus:  Goals Group:   The focus of this group is to help patients establish daily goals to achieve during treatment and discuss how the patient can incorporate goal setting into their daily lives to aide in recovery.    Participation Level:  Active  Participation Quality:  Appropriate  Affect:  Appropriate  Cognitive:  Appropriate  Insight: Appropriate  Engagement in Group:  Engaged  Modes of Intervention:  Exploration  Additional Comments:     Reymundo Poll 04/21/2022, 10:17 AM

## 2022-05-17 ENCOUNTER — Ambulatory Visit (INDEPENDENT_AMBULATORY_CARE_PROVIDER_SITE_OTHER): Payer: Medicaid Other | Admitting: Clinical

## 2022-05-17 DIAGNOSIS — F331 Major depressive disorder, recurrent, moderate: Secondary | ICD-10-CM | POA: Diagnosis not present

## 2022-05-17 NOTE — Progress Notes (Signed)
   THERAPIST PROGRESS NOTE  Session Time: 40 minutes  Participation Level: Active  Behavioral Response: CasualAlertEuthymic  Type of Therapy: Individual Therapy  Treatment Goals addressed: client will complete 80% of assigned homework  ProgressTowards Goals: Progressing  Interventions: CBT and Supportive  Summary:  Tiffany [name] is a 22 y.o. female who presents for scheduled appointment oriented x 5, appropriately dressed, and friendly.  Client denied hallucinations and delusions. Client reported on today she is feeling better. Client reported since she was last seen she was treated at [facility] on April 14. Client reported she had a overwhelming sense of depression and distress that have been building up over 2 months. Client reported she had not been telling the therapist everything that she was truly feeling up into her admitted to the hospital. Client reported her old stepdad over the past few months has been trying to become reacquainted with the family. Client reported she had been having mixed feelings about it because he heard her emotionally with how he left the family. Client reported also she had come to learn that her boyfriend cheated on her and had a pregnancy scare with a female. Client reported while she was in the hospital they were given a package and she utilized today to journal and be completely honest with herself. Client reported she liked it because it was a venting without having to worry about judgment from someone else. Client reported coming out of the hospital she feels like she can cope better with different things. Client reported she decided she is fine with the step dad coming around but she will not think of him as family. Client reported she is going to work things out with her boyfriend. Client reported she is going to work on spending time tending to her plants, family, and reading which all make her happy. Evidence of progress towards goal:   client reported having ta least 3 positive behavioral activation activities she can use to help her depression.   Suicidal/Homicidal: Nowithout intent/plan  Therapist Response:  Therapist began the appointment asking the client how she has been doing since last seen. Therapist used CBT to engage using active listening and positive emotional support. Therapist used CBT to engage and ask the client about events leading to her hospitalization. Therapist used CBT to ask the client how she is now solving the stressor related to family and her relationship. Therapist used CBT to positively reinforce her use of coping skills learned and how to be slower in reaction to triggers. Therapist used CBT ask the client to identify her progress with frequency of use with coping skills with continued practice in her daily activity.    Therapist assigned the client homework to practice self care.   Plan: Return again in 4 weeks.  Diagnosis: major depressive disorder, recurrent episode, moderate  Collaboration of Care: Patient refused AEB none requested by the client.  Patient/Guardian was advised Release of Information must be obtained prior to any record release in order to collaborate their care with an outside provider. Patient/Guardian was advised if they have not already done so to contact the registration department to sign all necessary forms in order for [place] to release information regarding their care.   Consent: Patient/Guardian gives verbal consent for treatment and assignment of benefits for services provided during this visit. Patient/Guardian expressed understanding and agreed to proceed.   [name] Aryn Kops, LCSW 05/17/2022

## 2022-05-18 ENCOUNTER — Ambulatory Visit (INDEPENDENT_AMBULATORY_CARE_PROVIDER_SITE_OTHER): Payer: Medicaid Other | Admitting: Student in an Organized Health Care Education/Training Program

## 2022-05-18 ENCOUNTER — Encounter (HOSPITAL_COMMUNITY): Payer: Self-pay | Admitting: Student in an Organized Health Care Education/Training Program

## 2022-05-18 VITALS — BP 132/76 | HR 80 | Resp 16 | Wt 201.0 lb

## 2022-05-18 DIAGNOSIS — F331 Major depressive disorder, recurrent, moderate: Secondary | ICD-10-CM | POA: Diagnosis not present

## 2022-05-18 DIAGNOSIS — F4310 Post-traumatic stress disorder, unspecified: Secondary | ICD-10-CM | POA: Diagnosis not present

## 2022-05-18 MED ORDER — SERTRALINE HCL 100 MG PO TABS
200.0000 mg | ORAL_TABLET | Freq: Every day | ORAL | 3 refills | Status: DC
Start: 2022-05-18 — End: 2022-07-01

## 2022-05-18 MED ORDER — HYDROXYZINE HCL 25 MG PO TABS
25.0000 mg | ORAL_TABLET | Freq: Every evening | ORAL | 3 refills | Status: DC | PRN
Start: 2022-05-18 — End: 2022-07-01

## 2022-05-18 NOTE — Progress Notes (Signed)
BH MD/PA/NP OP Progress Note  05/18/2022 11:09 AM Tiffany [name]  MRN:  [id number]  Chief Complaint:  Chief Complaint  Patient presents with   Follow-up   HPI: Tiffany [name] is a 22 year old patient with a history of mild depression, PTSD, adjustment disorder, ADHD, and dyslexia.  Patient reports she has been compliant with the following medication regimen:   Zoloft 200mg  daily Hydroxyzine 25mg  TID PRN  Patient completes assessment alone today, mom stayed in the waiting room as patient okayed doing assessment alone. Patient reports that since her hospitalization she is doing better. Patient reports that she is compliant with her medications. Patient reports that she only taking Hydroxyzine QHS. She reports that it has been helping her sleep. Patient reports that she does have some anxiety during the day, she does feel like she can manage it better. She reports that she is not isolating herself as much and has been trying to journal. She has been working out as well.   Patient reports that she is talking with her boyfriend. She reports that she communicating her emotions and setting boundaries. It turns out he did not get anyone pregnant, but he did have a sexual relationship with another female. Patient reports that she still wants to continue her relationship, but this incident has made her speak more about her own thoughts.Patient is hopeful and future oriented. Patient denies SI, HI, and AVH.   Patient is also setting boundaries with her ex stepfather, attempting to come back in her life. She reports that at one point she did see him as her dad, but  denies that this is worsening PTSD symptoms. She endorses that it is causing her feelings of abandonment to resurface, she is working through them in therapy.  She reports that her nightmares are decreasing and rare. Her hypervigilance and rumination are decreasing as well.   Patient reports that she feels like she is no longer "the same person"  she is "happier and less depressed." Patient reports that her motivation is increasing and her anhedonia has ceased. She endorses decreasing feelings of worthlessness and hopelessness.   Patient reports that she has stopped streaming and restricted her social media for her own mental health.   Patient reports that she feels more comfortable walking around alone, and now feels her mom is actually making it harder for her to be independent in public, but is not extremely irritated by this. She has gone 2 places by herself, when her mother could not go with her.   Visit Diagnosis:    ICD-10-CM   1. PTSD (post-traumatic stress disorder)  F43.10 sertraline (ZOLOFT) 100 MG tablet    hydrOXYzine (ATARAX) 25 MG tablet    2. Major depressive disorder, recurrent episode, moderate with anxious distress (HCC)  F33.1 hydrOXYzine (ATARAX) 25 MG tablet      Past Psychiatric History: ADHD, adjustment disorder, anxiety, depression, and dyslexia.    Last visit:  10/12/2021-patient Zoloft increased to 150 mg to address PTSD symptoms.   12/2021-patient working on becoming more independent in the grocery store, patient was also getting ready to have an upcoming trip with her boyfriend.  No medication adjustments were made as patient appeared to be benefiting on current dose.  03/2022- Patient's hypervigilance has significantly decreased and she is slowly becoming more independent. Patient also appears to be exhibiting less reliance on mom during assessments.   Update: 04/2022- Patient hospitalized after finding out her boyfriend of 9 years, that lives in [place] has gotten another  girl pregnant and she has been feeling very depressed and suicidal with thoughts of overdosing on pills.   Past Medical History:  Past Medical History:  Diagnosis Date   ADHD (attention deficit hyperactivity disorder) 2004   Took Adderall for several years, then Dexedrine, now Strattera   Asthma "since infancy"   triggers more  in winter, has had bronchitis/pneumonia   Dyslexia    Mood disorder (HCC) 2013    Past Surgical History:  Procedure Laterality Date   ADENOIDECTOMY     OTHER SURGICAL HISTORY     tubes in the ears   TONSILLECTOMY     TYMPANOSTOMY TUBE PLACEMENT      Family Psychiatric History: Medical: Reports grandma recently found cancerous polyps in her colon, history of heart problems.  Reports great grandma with seizures.  Reports family history of dementia Psych:  Mother dyslexia, eating disorder, substance use, anxiety  Brother dyslexia and bipolar, Maternal grandmother dyslexia, depression, anxiety  Psych Rx: Unknown SA/HA: Reports uncle committed suicide Substance use family hx: Reports family history of substance use  Family History:  Family History  Problem Relation Age of Onset   Learning disabilities Mother    Mental illness Mother    Drug abuse Mother    Learning disabilities Brother    Diabetes Maternal Uncle    Heart disease Maternal Grandmother    Hypertension Maternal Grandmother    Learning disabilities Maternal Grandmother    Depression Maternal Grandmother    Anxiety disorder Maternal Grandmother    Learning disabilities Maternal Grandfather    Heart murmur Maternal Grandfather    Alcohol abuse Neg Hx    Clotting disorder Neg Hx    Mental illness Father    Heart disease Father     Social History:  Social History   Socioeconomic History   Marital status: Single    Spouse name: Not on file   Number of children: Not on file   Years of education: Not on file   Highest education level: Not on file  Occupational History   Not on file  Tobacco Use   Smoking status: Never   Smokeless tobacco: Never   Tobacco comments:    mom quit!  Substance and Sexual Activity   Alcohol use: Never   Drug use: Never   Sexual activity: Not Currently  Other Topics Concern   Not on file  Social History Narrative   Not on file   Social Determinants of Health   Financial  Resource Strain: Not on file  Food Insecurity: No Food Insecurity (04/17/2022)   Hunger Vital Sign    Worried About Running Out of Food in the Last Year: Never true    Ran Out of Food in the Last Year: Never true  Transportation Needs: No Transportation Needs (04/17/2022)   PRAPARE - Administrator, Civil Service (Medical): No    Lack of Transportation (Non-Medical): No  Physical Activity: Not on file  Stress: Not on file  Social Connections: Not on file    Allergies:  Allergies  Allergen Reactions   Augmentin [Amoxicillin-Pot Clavulanate] Hives and Other (See Comments)        Bee Pollen    Latex    Penicillins Rash    Metabolic Disorder Labs: No results found for: "HGBA1C", "MPG" No results found for: "PROLACTIN" No results found for: "CHOL", "TRIG", "HDL", "CHOLHDL", "VLDL", "LDLCALC" Lab Results  Component Value Date   TSH 3.540 04/19/2022    Therapeutic Level Labs: No results found  for: "LITHIUM" No results found for: "VALPROATE" No results found for: "CBMZ"  Current Medications: Current Outpatient Medications  Medication Sig Dispense Refill   albuterol (VENTOLIN HFA) 108 (90 Base) MCG/ACT inhaler Inhale 1-2 puffs into the lungs every 6 (six) hours as needed for wheezing or shortness of breath.     hydrOXYzine (ATARAX) 25 MG tablet Take 1 tablet (25 mg total) by mouth at bedtime as needed for anxiety. 30 tablet 3   sertraline (ZOLOFT) 100 MG tablet Take 2 tablets (200 mg total) by mouth daily. 60 tablet 3   No current facility-administered medications for this visit.     Musculoskeletal: Strength & Muscle Tone: within normal limits Gait & Station: normal Patient leans: N/A  Psychiatric Specialty Exam: Review of Systems  Psychiatric/Behavioral:  Negative for dysphoric mood, hallucinations and suicidal ideas. The patient is not nervous/anxious.     Blood pressure 132/76, pulse 80, resp. rate 16, weight 201 lb (91.2 kg), SpO2 99 %.Body mass index is  36.76 kg/m.  General Appearance: Casual  Eye Contact:  Good  Speech:  Clear and Coherent  Volume:  Normal  Mood:  Euthymic  Affect:  Appropriate  Thought Process:  Coherent  Orientation:  Full (Time, Place, and Person)  Thought Content: Logical   Suicidal Thoughts:  No  Homicidal Thoughts:  No  Memory:  Immediate;   Good Recent;   Good  Judgement:  Good  Insight:  Good  Psychomotor Activity:  Normal  Concentration:  Concentration: Good  Recall:  Good  Fund of Knowledge: Good  Language: Good  Akathisia:  NA  Handed:    AIMS (if indicated): not done  Assets:  Communication Skills Desire for Improvement Housing Leisure Time Resilience Social Support Transportation  ADL's:  Intact  Cognition: WNL  Sleep:  Good   Screenings: AUDIT    Flowsheet Row [facility]  Alcohol Use Disorder Identification Test Final Score (AUDIT) 0      GAD-7    Flowsheet Row Counselor from 03/29/2022 in [facility] Counselor from 10/26/2021 in [facility] Counselor from 08/26/2021 in [facility] Counselor from 07/29/2021 in [facility] Video Visit from 07/12/2021 in [facility]  Total GAD-7 Score 6 7 7 6 10       PHQ2-9    Flowsheet Row ED from 04/16/2022 in [facility] Counselor from 03/29/2022 in [facility] Counselor from 10/26/2021 in [facility] Counselor from 08/26/2021 in [facility] Video Visit from 07/12/2021 in [facility]  PHQ-2 Total Score 2 4 2 4 3   PHQ-9 Total Score 7 12 8 10 11       Flowsheet Row Ad[facility]) [facility] ED from 04/16/2022 in [facility] Counselor from 04/09/2021 in [facility]  C-SSRS RISK CATEGORY Low Risk No Risk No Risk        Assessment and Plan: Patient appears to be doing very well on current medication regimen.  Patient PTSD symptoms have significantly decreased, and patient endorses feeling more in control of her emotions and actions.  Patient is using coping skills and engaging well with therapy.  Despite the acute event that occurred leading to hospitalization, patient ultimately benefited and has continued to set boundaries.  Patient also  did not act on the thoughts that led to her endorsing SI leading to hospitalization which is encouraging as patient continues to work on communication.  Patient also endorses significant decrease in social anxiety as she now feels more comfortable being in public and has the urge to do more independently.  Patient was able to complete assessment today alone without mom and appeared to feel comfortable.  Discussed with patient pending transition of care to a new resident starting July 1st, and likely discontinuation of care by this provider at that time.  Patient endorsed understanding and did not endorse any reservations.  MDD, recurrent, severe-improved PTSD-improved Social anxiety disorder-resolved - Continue Zoloft 200 mg daily - Continue hydroxyzine 25 mg nightly  Follow-up in approximately 2 months with Dr. Alfonse Flavors  Collaboration of Care: Collaboration of Care:   Patient/Guardian was advised Release of Information must be obtained prior to any record release in order to collaborate their care with an outside provider. Patient/Guardian was advised if they have not already done so to contact the registration department to sign all necessary forms in order for [place] to release information regarding their care.   Consent: Patient/Guardian gives verbal consent for treatment and assignment of benefits for services provided during this  visit. Patient/Guardian expressed understanding and agreed to proceed.   PGY-3 Bobbye Morton, MD 05/18/2022, 11:09 AM

## 2022-06-01 ENCOUNTER — Ambulatory Visit (INDEPENDENT_AMBULATORY_CARE_PROVIDER_SITE_OTHER): Payer: Medicaid Other | Admitting: Clinical

## 2022-06-01 DIAGNOSIS — F331 Major depressive disorder, recurrent, moderate: Secondary | ICD-10-CM | POA: Diagnosis not present

## 2022-06-02 NOTE — Progress Notes (Signed)
   THERAPIST PROGRESS NOTE  Session Time: 45 minutes  Participation Level: Active  Behavioral Response: CasualAlertDepressed  Type of Therapy: Individual Therapy  Treatment Goals addressed: client will identify 3 cognitive patterns and beliefs that support depression  ProgressTowards Goals: Progressing  Interventions: CBT and Supportive  Summary:  Tiffany [name] is a 22 y.o. female who presents for the scheduled appointment oriented times five, appropriately dressed and friendly. Client denied hallucinations and delusions. Client reported on today she is doing okay. Client reported she may be emotionally drained. Client reported she is not sure about where it is coming from. Client reported her relationship may be causing her a lot of stress. Client reported she has been having issues with trust in her relationship. Client reported she finds herself wanting to ask him frequently what he is doing. Client reported she does not like that she thinks that way. Client reported they talk throughout the day but she does find herself feeling lonely. Client reported she has done things with her plants and painting on some of the flower pots. Client reported she enjoys her plants as well as reading. Client reported she has cut back on her gaming. Client reported she has had a hard time of knowing how to balance her relationship and enjoy playing online/ streaming with her friend community. Client reported otherwise she is happy that she has had improved body confidence. Client reported she is wearing things that she once shy'd away from. Evidence of progress towards goal:  client reported she has 1 positive goal to attain of achieving balance with her relationship and own hobbies.   Suicidal/Homicidal: Nowithout intent/plan  Therapist Response:  Therapist began the appointment asking the client how she has been doing. Therapist used CBT to engage using active listening and positive emotional  support. Therapist used CBT to engage and ask the client to describe the nature of her thoughts and emotions with contributing stressors. Therapist used CBT to engage and ask the client to discuss how to manage her personal interests and other relationships. Therapist used CBT ask the client to identify her progress with frequency of use with coping skills with continued practice in her daily activity.    Therapist assigned the client homework to practice self care.    Plan: Return again in 3 weeks.  Diagnosis: major depressive disorder, recurrent episode, moderate  Collaboration of Care: Patient refused AEB none requested by the client.  Patient/Guardian was advised Release of Information must be obtained prior to any record release in order to collaborate their care with an outside provider. Patient/Guardian was advised if they have not already done so to contact the registration department to sign all necessary forms in order for [place] to release information regarding their care.   Consent: Patient/Guardian gives verbal consent for treatment and assignment of benefits for services provided during this visit. Patient/Guardian expressed understanding and agreed to proceed.   [name] Bela Nyborg, LCSW 06/01/2022

## 2022-06-15 ENCOUNTER — Ambulatory Visit (INDEPENDENT_AMBULATORY_CARE_PROVIDER_SITE_OTHER): Payer: Medicaid Other | Admitting: Clinical

## 2022-06-15 DIAGNOSIS — F331 Major depressive disorder, recurrent, moderate: Secondary | ICD-10-CM

## 2022-06-15 NOTE — Progress Notes (Signed)
   THERAPIST PROGRESS NOTE  Session Time: 45 minutes  Participation Level: Active  Behavioral Response: CasualAlertEuthymic  Type of Therapy: Individual Therapy  Treatment Goals addressed: client will identify 3 cognitive patterns and beliefs that support depression  ProgressTowards Goals: Progressing  Interventions: CBT and Supportive  Summary:  Tiffany [name] is a 22 y.o. female who presents for the appointment oriented times five, appropriately dressed and friendly. Client denied hallucinations and delusions. Client reported on today she has been doing fairly okay. Client reported she has been having some depressive symptoms. Client reported this past week has been the anniversary of her ex boyfriends death whom she was close to. Client reported she hates that she thinks so highly of her biological father now more than she did as a child. Client reported she is slowly on a path of forgiveness and having peace with him. Client reported she has good memories of him. Client reported it does make her sad to know she wont be able to have a relationship with him. Client reported she had been spending more time in bed. Client reported she misses him but can't let that hold her back. Client reported her boyfriend is coming to town this weekend and she is nervous. Client reported she does not completely understand her point of view on how he broke her trust among other things. Evidence of progress towards goal:  client reported 1 positive of working through her symptoms of grief.   Suicidal/Homicidal: Nowithout intent/plan  Therapist Response:  Therapist began the appointment asking the client how she has been doing since last seen. Therapist used CBT to engage using active listening and positive emotional support. Therapist used CBT to engage and ask the client to describe her mood the past two weeks and contributing factors. Therapist used CBT to engage and discuss boundaries, self  confidence and normalizing her emotions. Therapist used CBT ask the client to identify her progress with frequency of use with coping skills with continued practice in her daily activity.    Therapist assigned the client homework to practice self care.   Plan: Return again in 4 weeks.  Diagnosis: major depressive disorder, recurrent episode, moderate  Collaboration of Care: Patient refused AEB none requested by the client.  Patient/Guardian was advised Release of Information must be obtained prior to any record release in order to collaborate their care with an outside provider. Patient/Guardian was advised if they have not already done so to contact the registration department to sign all necessary forms in order for [place] to release information regarding their care.   Consent: Patient/Guardian gives verbal consent for treatment and assignment of benefits for services provided during this visit. Patient/Guardian expressed understanding and agreed to proceed.   [name] Hatley Henegar, LCSW 06/15/2022

## 2022-07-01 ENCOUNTER — Encounter (HOSPITAL_COMMUNITY): Payer: Self-pay | Admitting: Student in an Organized Health Care Education/Training Program

## 2022-07-01 ENCOUNTER — Ambulatory Visit (INDEPENDENT_AMBULATORY_CARE_PROVIDER_SITE_OTHER): Payer: Medicaid Other | Admitting: Student in an Organized Health Care Education/Training Program

## 2022-07-01 DIAGNOSIS — F3341 Major depressive disorder, recurrent, in partial remission: Secondary | ICD-10-CM | POA: Insufficient documentation

## 2022-07-01 DIAGNOSIS — F4310 Post-traumatic stress disorder, unspecified: Secondary | ICD-10-CM

## 2022-07-01 HISTORY — DX: Major depressive disorder, recurrent, in partial remission: F33.41

## 2022-07-01 MED ORDER — HYDROXYZINE HCL 25 MG PO TABS
25.0000 mg | ORAL_TABLET | Freq: Every evening | ORAL | 3 refills | Status: DC | PRN
Start: 2022-07-01 — End: 2022-10-28

## 2022-07-01 MED ORDER — SERTRALINE HCL 100 MG PO TABS
200.0000 mg | ORAL_TABLET | Freq: Every day | ORAL | 3 refills | Status: DC
Start: 2022-07-01 — End: 2022-10-28

## 2022-07-01 NOTE — Progress Notes (Signed)
BH MD/PA/NP OP Progress Note  07/01/2022 10:34 AM Tiffany [name]  MRN:  [id number]  Chief Complaint:  Chief Complaint  Patient presents with   Follow-up   HPI: Tiffany [name] is a 22 year old patient with a history of mild depression, PTSD, adjustment disorder, ADHD, and dyslexia.  Patient reports she has been compliant with the following medication regimen:    Zoloft 200mg  daily Hydroxyzine 25mg  TID PRN  Patient has not really needed daytime dosing of Hydroxyzine, but does take the at bedtime dose and gets 6-8h of sleep.   Patient reports that she is doing well. She has had a good conversation with her boyfriend and they are now on the same page and he is working on relearning her trust. Patient reports that she feels more reassured. Patient reports that she is not having issues with going out in public on her own. Patient reports that she spent a week at her boyfriend's house and did really well. Patient reports that she has some recent stressors finding a childhood pet dead and the house bills being unpaid. Patient reports that the acute stressors may have led to having more naps over the last week, but otherwise she feels she manages her stress fairly well. Patient reports that she thinks overall her energy level is good. Patient denies feeling irritable, constantly worried or on edge. Patient also denies feeling hopeless. Patient reports that she has noticed that with the medication her sadness is less pathologic and she is able to get her ADLs done and doesn't spend as much time crying. Patient reports that she has been reading and doing crafts.   Patient reports that she is thinking about going back to school. Patient reports that her appetite is pretty stable. Patient is averaging 2 meals/ day. Patient reports that she is doing yoga every other day. Patient denies SI, HI, and AVH.     Visit Diagnosis:    ICD-10-CM   1. PTSD (post-traumatic stress disorder)  F43.10 hydrOXYzine (ATARAX)  25 MG tablet    sertraline (ZOLOFT) 100 MG tablet    2. Major depressive disorder, recurrent episode, moderate with anxious distress (HCC)  F33.1 hydrOXYzine (ATARAX) 25 MG tablet      Past Psychiatric History: ADHD, adjustment disorder, anxiety, depression, and dyslexia.    Last visit:  10/12/2021-patient Zoloft increased to 150 mg to address PTSD symptoms.   12/2021-patient working on becoming more independent in the grocery store, patient was also getting ready to have an upcoming trip with her boyfriend.  No medication adjustments were made as patient appeared to be benefiting on current dose.   03/2022- Patient's hypervigilance has significantly decreased and she is slowly becoming more independent. Patient also appears to be exhibiting less reliance on mom during assessments.    Update: 04/2022- Patient hospitalized after finding out her boyfriend of 9 years, that lives in [place] has gotten another girl pregnant and she has been feeling very depressed and suicidal with thoughts of overdosing on pills.   05/2022-Patient is using coping skills and engaging well with therapy. Stabilized and doing well. No med adjustments. ignificant decrease in social anxiety.    Past Medical History:  Past Medical History:  Diagnosis Date   ADHD (attention deficit hyperactivity disorder) 2004   Took Adderall for several years, then Dexedrine, now Strattera   Asthma "since infancy"   triggers more in winter, has had bronchitis/pneumonia   Dyslexia    Mood disorder (HCC) 2013    Past Surgical History:  Procedure Laterality Date   ADENOIDECTOMY     OTHER SURGICAL HISTORY     tubes in the ears   TONSILLECTOMY     TYMPANOSTOMY TUBE PLACEMENT      Family Psychiatric History:  Medical: Reports grandma recently found cancerous polyps in her colon, history of heart problems.  Reports great grandma with seizures.  Reports family history of dementia Psych:  Mother dyslexia, eating disorder,  substance use, anxiety  Brother dyslexia and bipolar, Maternal grandmother dyslexia, depression, anxiety  Psych Rx: Unknown SA/HA: Reports uncle committed suicide Substance use family hx: Reports family history of substance use    Family History:  Family History  Problem Relation Age of Onset   Learning disabilities Mother    Mental illness Mother    Drug abuse Mother    Learning disabilities Brother    Diabetes Maternal Uncle    Heart disease Maternal Grandmother    Hypertension Maternal Grandmother    Learning disabilities Maternal Grandmother    Depression Maternal Grandmother    Anxiety disorder Maternal Grandmother    Learning disabilities Maternal Grandfather    Heart murmur Maternal Grandfather    Alcohol abuse Neg Hx    Clotting disorder Neg Hx    Mental illness Father    Heart disease Father     Social History:  Social History   Socioeconomic History   Marital status: Single    Spouse name: Not on file   Number of children: Not on file   Years of education: Not on file   Highest education level: Not on file  Occupational History   Not on file  Tobacco Use   Smoking status: Never   Smokeless tobacco: Never   Tobacco comments:    mom quit!  Substance and Sexual Activity   Alcohol use: Never   Drug use: Never   Sexual activity: Not Currently  Other Topics Concern   Not on file  Social History Narrative   Not on file   Social Determinants of Health   Financial Resource Strain: Not on file  Food Insecurity: No Food Insecurity (04/17/2022)   Hunger Vital Sign    Worried About Running Out of Food in the Last Year: Never true    Ran Out of Food in the Last Year: Never true  Transportation Needs: No Transportation Needs (04/17/2022)   PRAPARE - Administrator, Civil Service (Medical): No    Lack of Transportation (Non-Medical): No  Physical Activity: Not on file  Stress: Not on file  Social Connections: Not on file    Allergies:   Allergies  Allergen Reactions   Augmentin [Amoxicillin-Pot Clavulanate] Hives and Other (See Comments)        Bee Pollen    Latex    Penicillins Rash    Metabolic Disorder Labs: No results found for: "HGBA1C", "MPG" No results found for: "PROLACTIN" No results found for: "CHOL", "TRIG", "HDL", "CHOLHDL", "VLDL", "LDLCALC" Lab Results  Component Value Date   TSH 3.540 04/19/2022    Therapeutic Level Labs: No results found for: "LITHIUM" No results found for: "VALPROATE" No results found for: "CBMZ"  Current Medications: Current Outpatient Medications  Medication Sig Dispense Refill   albuterol (VENTOLIN HFA) 108 (90 Base) MCG/ACT inhaler Inhale 1-2 puffs into the lungs every 6 (six) hours as needed for wheezing or shortness of breath.     hydrOXYzine (ATARAX) 25 MG tablet Take 1 tablet (25 mg total) by mouth at bedtime as needed for anxiety. 30 tablet  3   sertraline (ZOLOFT) 100 MG tablet Take 2 tablets (200 mg total) by mouth daily. 60 tablet 3   No current facility-administered medications for this visit.     Musculoskeletal: Strength & Muscle Tone: within normal limits Gait & Station: normal Patient leans: N/A  Psychiatric Specialty Exam: Review of Systems  Psychiatric/Behavioral:  Negative for dysphoric mood, hallucinations, sleep disturbance and suicidal ideas. The patient is not nervous/anxious.     There were no vitals taken for this visit.There is no height or weight on file to calculate BMI.  General Appearance: Casual  Eye Contact:  Good  Speech:  Clear and Coherent  Volume:  Normal  Mood:  Euthymic  Affect:  Appropriate  Thought Process:  Coherent  Orientation:  Full (Time, Place, and Person)  Thought Content: Logical   Suicidal Thoughts:  No  Homicidal Thoughts:  No  Memory:  Immediate;   Good Recent;   Good  Judgement:  Fair  Insight:  Fair  Psychomotor Activity:  Normal  Concentration:  Concentration: Good  Recall:  Good  Fund of Knowledge:  Good  Language: Good  Akathisia:  No  Handed:    AIMS (if indicated): not done  Assets:  Communication Skills Desire for Improvement Housing Leisure Time Resilience Social Support  ADL's:  Intact  Cognition: WNL  Sleep:  Good   Screenings: AUDIT    Flowsheet Row [facility]  Alcohol Use Disorder Identification Test Final Score (AUDIT) 0      GAD-7    Flowsheet Row Counselor from 03/29/2022 in [facility] Counselor from 10/26/2021 in [facility] Counselor from 08/26/2021 in [facility] Counselor from 07/29/2021 in [facility] Video Visit from 07/12/2021 in [facility]  Total GAD-7 Score 6 7 7 6 10       PHQ2-9    Flowsheet Row ED from 04/16/2022 in [facility] Counselor from 03/29/2022 in [facility] Counselor from 10/26/2021 in [facility] Counselor from 08/26/2021 in [facility] Video Visit from 07/12/2021 in [facility]  PHQ-2 Total Score 2 4 2 4 3   PHQ-9 Total Score 7 12 8 10 11       Flowsheet Row Ad[facility] ([facility] ED from 04/16/2022 in [facility] Counselor from 04/09/2021 in [facility]  C-SSRS RISK CATEGORY Low Risk No Risk No Risk        Assessment and Plan: Patient doing well. She continues to exercise what she is learning in therapy and push herself. Patient is not endorsing symptoms of PTSD or social anxiety any longer. While she is still preferring to stay home, she is considering working or going to school. Patient MDD is now in partial remission.   Discussed with patient pending  transition of care to a new resident starting July 1st, and likely discontinuation of care by this provider at that time.    MDD in partial remission PTSD-improved Social anxiety disorder-resolved - Continue Zoloft 200 mg daily - Continue hydroxyzine 25 mg nightly   F/u in 09/2022 with Dr. Alfonse Flavors Collaboration of Care: Collaboration of Care:   Patient/Guardian was advised Release of Information must be obtained prior to any record release in order to collaborate their care with an outside provider.  Patient/Guardian was advised if they have not already done so to contact the registration department to sign all necessary forms in order for [place] to release information regarding their care.   Consent: Patient/Guardian gives verbal consent for treatment and assignment of benefits for services provided during this visit. Patient/Guardian expressed understanding and agreed to proceed.   PGY-3 Bobbye Morton, MD 07/01/2022, 10:34 AM

## 2022-07-12 ENCOUNTER — Ambulatory Visit (HOSPITAL_COMMUNITY): Payer: Medicaid Other | Admitting: Clinical

## 2022-07-28 ENCOUNTER — Ambulatory Visit (INDEPENDENT_AMBULATORY_CARE_PROVIDER_SITE_OTHER): Payer: Medicaid Other | Admitting: Student

## 2022-07-28 ENCOUNTER — Encounter (HOSPITAL_COMMUNITY): Payer: Self-pay | Admitting: Student

## 2022-07-28 VITALS — BP 116/73 | HR 68 | Ht 62.0 in | Wt 198.0 lb

## 2022-07-28 DIAGNOSIS — F4312 Post-traumatic stress disorder, chronic: Secondary | ICD-10-CM | POA: Diagnosis not present

## 2022-07-28 DIAGNOSIS — F3341 Major depressive disorder, recurrent, in partial remission: Secondary | ICD-10-CM

## 2022-07-28 NOTE — Progress Notes (Signed)
BH MD/PA/NP OP Progress Note  07/28/2022 3:30 PM Tiffany [name]  MRN:  [id number]  Chief Complaint:  Chief Complaint  Patient presents with   Follow-up   HPI: Tiffany [name] is a 22 year old patient with a history of mild depression, PTSD, adjustment disorder, ADHD, and dyslexia.  Patient reports she has been compliant with the following medication regimen:    Zoloft 200mg  daily Hydroxyzine 25mg  TID PRN   Patient reports that things have been going well since her last visit, and she is largely stable. She did find her family's pet turtle deceased, which caused some fluctuations to her mood, but did not cause her to decompensate. She has been paying attention to her emotions well, and she is less emotional overall since increasing Zoloft to 200 mg. She finds lowering of her mood at night when thinking and worrying about different things, as she is no longer busy and keeping her mind occupied. This is also less, maybe 2 nights per week. She is otherwise sleeping through the night. She is taking hydroxyzine nightly, and daily one time per month.   Her appetite is the same as last visit; she eats in the morning (eggs or peanut butter sandwich), and sometimes dinner (full meal). Drinks avg of 32 oz water bottles. Denies soda and juice. Drinks tea, herbal tea.   Denies physical sx/ AE of medicatons. No longer feeling tired, as she is sleeping well through the night and taking naps. Exercising with dog. Walking most days.   Family: doesn't talk to grandmother, due to grandmother's gosspipping. She sets boundaries with her now. No problems with grandpa. Mom and brother good supports, all live together. Sometimes boyfriend; they are working through infidelity. Does not talk with him about feelings anymore because does not feel as though she can confide in him right now. As well as online friends. Has three good friends. Can connect better with them. She feels much more confident in setting boundaries.    Denies SI, HI, and AVH. Therapy going well.   Secondhand smoke from both grandparents, mom vapes, denies personal use of tobacco products, alcohol, and illicit substances.      Visit Diagnosis:    ICD-10-CM   1. MDD (major depressive disorder), recurrent, in partial remission (HCC)  F33.41     2. Chronic post-traumatic stress disorder (PTSD)  F43.12        Past Psychiatric History: ADHD, adjustment disorder, anxiety, depression, and dyslexia.    Last visit:  10/12/2021-patient Zoloft increased to 150 mg to address PTSD symptoms.   12/2021-patient working on becoming more independent in the grocery store, patient was also getting ready to have an upcoming trip with her boyfriend.  No medication adjustments were made as patient appeared to be benefiting on current dose.   03/2022- Patient's hypervigilance has significantly decreased and she is slowly becoming more independent. Patient also appears to be exhibiting less reliance on mom during assessments.    Update: 04/2022- Patient hospitalized after finding out her boyfriend of 9 years, that lives in [place] has gotten another girl pregnant and she has been feeling very depressed and suicidal with thoughts of overdosing on pills.   05/2022-Patient is using coping skills and engaging well with therapy. Stabilized and doing well. No med adjustments. ignificant decrease in social anxiety.    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  Reports great grandma with seizures.  Reports family history of dementia Psych:  Mother dyslexia, eating disorder, substance use, anxiety  Brother dyslexia and bipolar, Maternal grandmother dyslexia, depression, anxiety  Psych Rx: Unknown SA/HA: Reports uncle committed suicide Substance use family hx: Reports family history of substance use    Family History:  Family History  Problem Relation Age of Onset   Learning disabilities Mother    Mental illness Mother    Drug abuse Mother    Learning disabilities Brother    Diabetes Maternal Uncle    Heart disease Maternal Grandmother    Hypertension Maternal Grandmother    Learning disabilities Maternal Grandmother    Depression Maternal Grandmother    Anxiety disorder Maternal Grandmother    Learning disabilities Maternal Grandfather    Heart murmur Maternal Grandfather    Alcohol abuse Neg Hx    Clotting disorder Neg Hx    Mental illness Father    Heart disease Father     Social History:  Social History   Socioeconomic History   Marital status: Single    Spouse name: Not on file   Number of children: Not on file   Years of education: Not on file   Highest education level: Not on file  Occupational History   Not on file  Tobacco Use   Smoking status: Never    Passive exposure: Current   Smokeless tobacco: Never   Tobacco comments:    Both grandparents "chain smokers;" mom vapes  Substance and Sexual Activity   Alcohol use: Never   Drug use: Never   Sexual activity: Not Currently  Other Topics Concern   Not on file  Social History Narrative   Not on file   Social Determinants of Health   Financial Resource Strain: Not on file  Food Insecurity: No Food Insecurity (04/17/2022)   Hunger Vital Sign    Worried About Running Out of Food in the Last Year: Never true    Ran Out of Food in the Last Year: Never true  Transportation Needs: No Transportation  Needs (04/17/2022)   PRAPARE - Administrator, Civil Service (Medical): No    Lack of Transportation (Non-Medical): No  Physical Activity: Not on file  Stress: Not on file  Social Connections: Not on file    Allergies:  Allergies  Allergen Reactions   Augmentin [Amoxicillin-Pot Clavulanate] Hives and Other (See Comments)        Bee Pollen    Latex    Penicillins Rash    Metabolic Disorder Labs: No results found for: "HGBA1C", "MPG" No results found for: "PROLACTIN" No results found for: "CHOL", "TRIG", "HDL", "CHOLHDL", "VLDL", "LDLCALC" Lab Results  Component Value Date   TSH 3.540 04/19/2022    Therapeutic Level Labs: No results found for: "LITHIUM" No results found for: "VALPROATE" No results found for: "CBMZ"  Current Medications: Current Outpatient Medications  Medication Sig Dispense Refill   albuterol (VENTOLIN HFA) 108 (90 Base) MCG/ACT inhaler Inhale 1-2 puffs into  the lungs every 6 (six) hours as needed for wheezing or shortness of breath.     hydrOXYzine (ATARAX) 25 MG tablet Take 1 tablet (25 mg total) by mouth at bedtime as needed for anxiety. 30 tablet 3   sertraline (ZOLOFT) 100 MG tablet Take 2 tablets (200 mg total) by mouth daily. 60 tablet 3   No current facility-administered medications for this visit.     Musculoskeletal: Strength & Muscle Tone: within normal limits Gait & Station: normal Patient leans: N/A  Psychiatric Specialty Exam: Review of Systems  Psychiatric/Behavioral:  Negative for dysphoric mood, hallucinations, sleep disturbance and suicidal ideas. The patient is not nervous/anxious.     Blood pressure 116/73, pulse 68, height 5\' 2"  (1.575 m), weight 198 lb (89.8 kg), SpO2 98%.Body mass index is 36.21 kg/m.  General Appearance: Casual  Eye Contact:  Good  Speech:  Clear and Coherent  Volume:  Normal  Mood:  Euthymic  Affect:  Appropriate  Thought Process:  Coherent  Orientation:  Full (Time, Place, and [name])   Thought Content: Logical   Suicidal Thoughts:  No  Homicidal Thoughts:  No  Memory:  Immediate;   Good Recent;   Good  Judgement:  Good  Insight:  Good  Psychomotor Activity:  Normal  Concentration:  Concentration: Good  Recall:  Good  Fund of Knowledge: Good  Language: Good  Akathisia:  No  Handed:    AIMS (if indicated): not done  Assets:  Communication Skills Desire for Improvement Housing Leisure Time Resilience Social Support  ADL's:  Intact  Cognition: WNL  Sleep:  Good   Screenings: AUDIT    Flowsheet Row [facility]  Alcohol Use Disorder Identification Test Final Score (AUDIT) 0      GAD-7    Flowsheet Row Counselor from 03/29/2022 in [facility] Counselor from 10/26/2021 in [facility] Counselor from 08/26/2021 in [facility] Counselor from 07/29/2021 in [facility] Video Visit from 07/12/2021 in [facility]  Total GAD-7 Score 6 7 7 6 10       PHQ2-9    Flowsheet Row ED from 04/16/2022 in [facility] Counselor from 03/29/2022 in [facility] Counselor from 10/26/2021 in [facility] Counselor from 08/26/2021 in [facility] Video Visit from 07/12/2021 in [facility]  PHQ-2 Total Score 2 4 2 4 3   PHQ-9 Total Score 7 12 8 10 11       Flowsheet Row Admission (Dis[facility] ED from 04/16/2022 in [facility] Counselor from 04/09/2021 in [facility]  C-SSRS RISK CATEGORY Low Risk No Risk No Risk        Assessment and Plan: Patient doing well. She is tolerating her medication regimen well. Patient  is not endorsing symptoms of PTSD or social anxiety any longer. She feels more confident about regaining her independence. Patient MDD remains in partial remission.  MDD in partial remission PTSD-improved Social anxiety disorder-resolved - Continue Zoloft 200 mg daily - Continue hydroxyzine 25 mg nightly   F/u in 3 months Collaboration of Care: Collaboration of Care:   Patient/Guardian was advised Release of Information must be obtained prior to any record release in order to collaborate their care with an outside provider. Patient/Guardian was advised  if they have not already done so to contact the registration department to sign all necessary forms in order for [place] to release information regarding their care.   Consent: Patient/Guardian gives verbal consent for treatment and assignment of benefits for services provided during this visit. Patient/Guardian expressed understanding and agreed to proceed.   PGY-3 Lamar Sprinkles, MD 07/28/2022, 3:30 PM

## 2022-07-29 NOTE — Addendum Note (Signed)
Addended by: Theodoro Kos A on: 07/29/2022 08:26 AM   Modules accepted: Level of Service

## 2022-08-09 ENCOUNTER — Ambulatory Visit (INDEPENDENT_AMBULATORY_CARE_PROVIDER_SITE_OTHER): Payer: Medicaid Other | Admitting: Clinical

## 2022-08-09 DIAGNOSIS — F331 Major depressive disorder, recurrent, moderate: Secondary | ICD-10-CM

## 2022-08-09 NOTE — Progress Notes (Unsigned)
   THERAPIST PROGRESS NOTE  Session Time: 45 minutes  Participation Level: Active  Behavioral Response: CasualAlertDepressed  Type of Therapy: Individual Therapy  Treatment Goals addressed: client will identify 3 cognitive patterns and beliefs that support depression  ProgressTowards Goals: Progressing  Interventions: CBT and Supportive  Summary:  [name] is a 22 y.o. female who presents for the scheduled appointment oriented times five, appropriately dressed and friendly. Client denied hallucinations and delusions. Client reported she has been going through a lot. Client reported her childhood cat passed away. Client reported she had him cremated. Client reported she has been depressed. Client reported she has been laying around more than usual. Client reported also with her birthday coming up she does not want to celebrate. Client reported she sees everyone else going on to accomplish things and she has not. Client reported her family is making her celebrate. Client reported she has bad memories associated to her childhood birthdays. Client reported for example she was grounded and not allowed to celebrate while her family ate cake and enjoyed time. Client reported also finding out her step dad cheated on her mom on her birthday. Client reported while she has been depressed she has thoughts worrying she is neglecting her boyfriend. Client reported she hs thoughts about her future and worried if she will be able to break the generational cycle of her family.  Evidence of progress towards goal:  client reported she has been laying in bed for more than 7 days.  Suicidal/Homicidal: Nowithout intent/plan  Therapist Response:  Therapist began the appointment asking the client how she has been doing. Therapist used CBT to engage using active listening and positive emotional support. Therapist used CBT to engage and give the client time to discuss her thoughts and feelings. Therapist  used CBT to engage and normalize the client emotional response. Therapist used CBT to engage and discuss coping skills for depression. Therapist used CBT ask the client to identify her progress with frequency of use with coping skills with continued practice in her daily activity.    Therapist assigned the client homework to improve her depression by cleaning up her room.   Plan: Return again in 4 weeks.  Diagnosis: major depressive disorder, recurrent episode, moderate  Collaboration of Care: Patient refused AEB none requested by the client.  Patient/Guardian was advised Release of Information must be obtained prior to any record release in order to collaborate their care with an outside provider. Patient/Guardian was advised if they have not already done so to contact the registration department to sign all necessary forms in order for [place] to release information regarding their care.   Consent: Patient/Guardian gives verbal consent for treatment and assignment of benefits for services provided during this visit. Patient/Guardian expressed understanding and agreed to proceed.   [name] Marcio Hoque, LCSW 08/09/2022

## 2022-09-06 ENCOUNTER — Ambulatory Visit (INDEPENDENT_AMBULATORY_CARE_PROVIDER_SITE_OTHER): Payer: Medicaid Other | Admitting: Clinical

## 2022-09-06 DIAGNOSIS — F331 Major depressive disorder, recurrent, moderate: Secondary | ICD-10-CM

## 2022-09-06 NOTE — Progress Notes (Signed)
   THERAPIST PROGRESS NOTE  Session Time: 45 minutes  Participation Level: Active  Behavioral Response: CasualAlertDepressed  Type of Therapy: Individual Therapy  Treatment Goals addressed: Client will complete at least 80% of assigned homework  ProgressTowards Goals: Progressing  Interventions: CBT and Supportive  Summary:  DANEISHA WAMBACH is a 22 y.o. female who presents for the scheduled appointment oriented x 5, appropriately dressed, and friendly.  Client denied hallucinations and delusions. Client reported on today she has been dealing with a lot of stress and worry.  Client reported her dog has a spinal problem and she is unable to use her back legs.  Client reported because no one else is able to help her look after the dog she feels like she is secluded to sitting around to attend to her dog.  Client reported she previously had a cat that passed away due to being trapped in a box in the house.  Client reported her grandmother is ordered and she holds some resentment toward her grandmother for not keeping her things clean and neat. Client reported she has been worried about her older brother.  Client reported when she is not around her grandmother uses him as a "butler". Client reported that her grandmother tells him negative things about himself to the point where it he has obviously regressed and is back to being secluded, crying, and not performing self hygiene activities. Client reported she has never been more worried as she is now that he may act on his suicidal ideations that he has vocalized to her.  Client reported she has also been worried about her long distance relationship.  Client reported she will not be with her boyfriend for the holidays and he has talked to her about feeling lonely.  Client reported she is also worried about him going back to school and him being faithful during the relationship. Evidence of progress towards goal: Client reported 1 factor contributing to  her depression is experiencing grief within the past month.    Suicidal/Homicidal: Nowithout intent/plan  Therapist Response:  Therapist began the appointment asking the client how she has been doing since last seen. Therapist used CBT to engage using active listening and positive emotional support. Therapist used CBT to give the client time to discuss her thoughts and feelings pertaining to grief, family dynamic, and her overall perspective towards the future. Therapist used CBT to teach the client about self-confidence and reframing negative thought patterns. Therapist used CBT ask the client to identify her progress with frequency of use with coping skills with continued practice in her daily activity.    Therapist assigned the client homework to practice self-care and positive behavioral activation activities.   Plan: Return again in 4 weeks.  Diagnosis: Major depressive disorder, recurrent episode, moderate  Collaboration of Care: Patient refused AEB none requested by the client.  Patient/Guardian was advised Release of Information must be obtained prior to any record release in order to collaborate their care with an outside provider. Patient/Guardian was advised if they have not already done so to contact the registration department to sign all necessary forms in order for [place] to release information regarding their care.   Consent: Patient/Guardian gives verbal consent for treatment and assignment of benefits for services provided during this visit. Patient/Guardian expressed understanding and agreed to proceed.   [name] D'Arcy Abraha, LCSW 09/06/2022

## 2022-09-08 ENCOUNTER — Encounter (HOSPITAL_COMMUNITY): Payer: Medicaid Other | Admitting: Student

## 2022-10-11 ENCOUNTER — Ambulatory Visit (INDEPENDENT_AMBULATORY_CARE_PROVIDER_SITE_OTHER): Payer: Medicaid Other | Admitting: Clinical

## 2022-10-11 DIAGNOSIS — F331 Major depressive disorder, recurrent, moderate: Secondary | ICD-10-CM | POA: Diagnosis not present

## 2022-10-11 NOTE — Progress Notes (Signed)
   THERAPIST PROGRESS NOTE  Session Time: 45 minutes  Participation Level: Active  Behavioral Response: CasualAlertEuthymic  Type of Therapy: Individual Therapy  Treatment Goals addressed: Kensie WILL IDENTIFY 3 COGNITIVE PATTERNS AND BELIEFS THAT SUPPORT DEPRESSION   ProgressTowards Goals: Progressing  Interventions: CBT and Supportive  Summary:  AGNIESZKA NEWHOUSE is a 22 y.o. female who presents for the scheduled appointment oriented x 5, appropriately dressed, friendly.  Client denied hallucinations and delusions. Client reported on today she has been doing pretty well.  Client reported her boyfriend started school in early September but did not tell her until afterwards.  Client reported while he is busy with school she is practicing her on boundary of keeping herself occupied during the day and not talking to him as often.  Client reported he makes comments about being lonely or her being distant but she is seeing things as he has certain things he has been for himself and she has to also create her life and have hobbies that she is busy with in the meantime.  Client reported she has also been making boundaries with her friend.  Client reported instead of ruminating on someone coming office disrespectful towards her she removes herself from the situation and does not allow herself to be he manipulated into his psychoactive back behaviors.  Client reported overall she has not been depressed but still has symptoms of depression.  Client reported she tends to sleep or being in bed most of the day and has random depressive mood swings.  Client reported she tends to her plants and takes her dog out and will play on her video games. Evidence of progress towards goal: Client reported 2 signs of depression that she needs to work on.  Suicidal/Homicidal: Nowithout intent/plan  Therapist Response:  Therapist began the appointment asking the client how she has been doing since last seen. Therapist  used CBT to engage using active listening and positive emotional support. Therapist used CBT to ask the client to identify stressors that may contribute to depression symptoms. Therapist used CBT to positively reinforce the client creating emotional and other boundaries for herself to normalize prioritizing herself. Therapist used CBT to teach the client about tubes and boundaries to use for herself as far as maintaining herself.  During depressive days. Therapist used CBT ask the client to identify her progress with frequency of use with coping skills with continued practice in her daily activity.    Therapist assigned client homework to have a timeframe in which she keeps herself active during the day before staying in bed.   Plan: Return again in 4 weeks.  Diagnosis:   Collaboration of Care: Patient refused AEB none requested.  Patient/Guardian was advised Release of Information must be obtained prior to any record release in order to collaborate their care with an outside provider. Patient/Guardian was advised if they have not already done so to contact the registration department to sign all necessary forms in order for [place] to release information regarding their care.   Consent: Patient/Guardian gives verbal consent for treatment and assignment of benefits for services provided during this visit. Patient/Guardian expressed understanding and agreed to proceed.   [name] Drexel Ivey, LCSW 10/11/2022

## 2022-10-28 ENCOUNTER — Ambulatory Visit (INDEPENDENT_AMBULATORY_CARE_PROVIDER_SITE_OTHER): Payer: Medicaid Other | Admitting: Student

## 2022-10-28 DIAGNOSIS — F4310 Post-traumatic stress disorder, unspecified: Secondary | ICD-10-CM | POA: Diagnosis not present

## 2022-10-28 DIAGNOSIS — F3341 Major depressive disorder, recurrent, in partial remission: Secondary | ICD-10-CM

## 2022-10-28 MED ORDER — SERTRALINE HCL 100 MG PO TABS
200.0000 mg | ORAL_TABLET | Freq: Every day | ORAL | 3 refills | Status: DC
Start: 2022-10-28 — End: 2023-01-26

## 2022-10-28 MED ORDER — HYDROXYZINE HCL 25 MG PO TABS
25.0000 mg | ORAL_TABLET | Freq: Every evening | ORAL | 3 refills | Status: DC | PRN
Start: 2022-10-28 — End: 2023-01-26

## 2022-10-28 NOTE — Progress Notes (Unsigned)
BH MD/PA/NP OP Progress Note  10/28/2022 9:10 AM Tiffany [name]  MRN:  [id number]  Chief Complaint:  No chief complaint on file.  HPI: Tiffany [name] is a 22 year old patient with a history of mild depression, PTSD, adjustment disorder, ADHD, and dyslexia.  Patient reports she has been compliant with the following medication regimen:    Zoloft 200mg  daily Hydroxyzine 25mg  TID PRN   Patient reports that things have been going well since her last visit, and she is largely stable. She gets hit in waves with grief since the loss of her cat. Some nights with increased sleep latency, but is able to sleep through the night. Getting six hours of sleep, on average. Most of the time feels well rested.   Requires PRN hydroxyzine about 3 nights per week. Believes medications are working well and denies need for adjustments.   She sometimes has passive SI (maybe my family would be better off without me), but denies intent to act on them. She writes it out, and it improves.     She did find her family's pet turtle deceased, which caused some fluctuations to her mood, but did not cause her to decompensate. She has been paying attention to her emotions well, and she is less emotional overall since increasing Zoloft to 200 mg. She finds lowering of her mood at night when thinking and worrying about different things, as she is no longer busy and keeping her mind occupied. This is also less, maybe 2 nights per week. She is otherwise sleeping through the night. She is taking hydroxyzine nightly, and daily one time per month.   Her appetite has decreased, but she is also actively trying to lose weight. Exercising, doing yoga and meditation 4 days weekly.  Denies SI, HI, and AVH. Therapy going well.   Secondhand smoke from both grandparents, mom vapes, denies personal use of tobacco products, alcohol, and illicit substances.      Visit Diagnosis:  No diagnosis found.    Past Psychiatric History: ADHD,  adjustment disorder, anxiety, depression, and dyslexia.    Last visit:  10/12/2021-patient Zoloft increased to 150 mg to address PTSD symptoms.   12/2021-patient working on becoming more independent in the grocery store, patient was also getting ready to have an upcoming trip with her boyfriend.  No medication adjustments were made as patient appeared to be benefiting on current dose.   03/2022- Patient's hypervigilance has significantly decreased and she is slowly becoming more independent. Patient also appears to be exhibiting less reliance on mom during assessments.    Update: 04/2022- Patient hospitalized after finding out her boyfriend of 9 years, that lives in [place] has gotten another girl pregnant and she has been feeling very depressed and suicidal with thoughts of overdosing on pills.   05/2022-Patient is using coping skills and engaging well with therapy. Stabilized and doing well. No med adjustments. ignificant decrease in social anxiety.    Past Medical History:  Past Medical History:  Diagnosis Date   ADHD (attention deficit hyperactivity disorder) 2004   Took Adderall for several years, then Dexedrine, now Strattera   Asthma "since infancy"   triggers more in winter, has had bronchitis/pneumonia   Dyslexia    Mood disorder (HCC) 2013    Past Surgical History:  Procedure Laterality Date   ADENOIDECTOMY     OTHER SURGICAL HISTORY     tubes in the ears   TONSILLECTOMY     TYMPANOSTOMY TUBE PLACEMENT      Family  Psychiatric History:  Medical: Reports grandma recently found cancerous polyps in her colon, history of heart problems.  Reports great grandma with seizures.  Reports family history of dementia Psych:  Mother dyslexia, eating disorder, substance use, anxiety  Brother dyslexia and bipolar, Maternal grandmother dyslexia, depression, anxiety  Psych Rx: Unknown SA/HA: Reports uncle committed suicide Substance use family hx: Reports family history of substance  use    Family History:  Family History  Problem Relation Age of Onset   Learning disabilities Mother    Mental illness Mother    Drug abuse Mother    Learning disabilities Brother    Diabetes Maternal Uncle    Heart disease Maternal Grandmother    Hypertension Maternal Grandmother    Learning disabilities Maternal Grandmother    Depression Maternal Grandmother    Anxiety disorder Maternal Grandmother    Learning disabilities Maternal Grandfather    Heart murmur Maternal Grandfather    Alcohol abuse Neg Hx    Clotting disorder Neg Hx    Mental illness Father    Heart disease Father     Social History:  Social History   Socioeconomic History   Marital status: Single    Spouse name: Not on file   Number of children: Not on file   Years of education: Not on file   Highest education level: Not on file  Occupational History   Not on file  Tobacco Use   Smoking status: Never    Passive exposure: Current   Smokeless tobacco: Never   Tobacco comments:    Both grandparents "chain smokers;" mom vapes  Substance and Sexual Activity   Alcohol use: Never   Drug use: Never   Sexual activity: Not Currently  Other Topics Concern   Not on file  Social History Narrative   Not on file   Social Determinants of Health   Financial Resource Strain: Not on file  Food Insecurity: No Food Insecurity (04/17/2022)   Hunger Vital Sign    Worried About Running Out of Food in the Last Year: Never true    Ran Out of Food in the Last Year: Never true  Transportation Needs: No Transportation Needs (04/17/2022)   PRAPARE - Administrator, Civil Service (Medical): No    Lack of Transportation (Non-Medical): No  Physical Activity: Not on file  Stress: Not on file  Social Connections: Not on file    Allergies:  Allergies  Allergen Reactions   Augmentin [Amoxicillin-Pot Clavulanate] Hives and Other (See Comments)        Bee Pollen    Latex    Penicillins Rash    Metabolic  Disorder Labs: No results found for: "HGBA1C", "MPG" No results found for: "PROLACTIN" No results found for: "CHOL", "TRIG", "HDL", "CHOLHDL", "VLDL", "LDLCALC" Lab Results  Component Value Date   TSH 3.540 04/19/2022    Therapeutic Level Labs: No results found for: "LITHIUM" No results found for: "VALPROATE" No results found for: "CBMZ"  Current Medications: Current Outpatient Medications  Medication Sig Dispense Refill   albuterol (VENTOLIN HFA) 108 (90 Base) MCG/ACT inhaler Inhale 1-2 puffs into the lungs every 6 (six) hours as needed for wheezing or shortness of breath.     hydrOXYzine (ATARAX) 25 MG tablet Take 1 tablet (25 mg total) by mouth at bedtime as needed for anxiety. 30 tablet 3   sertraline (ZOLOFT) 100 MG tablet Take 2 tablets (200 mg total) by mouth daily. 60 tablet 3   No current facility-administered medications for  this visit.     Musculoskeletal: Strength & Muscle Tone: within normal limits Gait & Station: normal Patient leans: N/A  Psychiatric Specialty Exam: Review of Systems  Psychiatric/Behavioral:  Negative for dysphoric mood, hallucinations, sleep disturbance and suicidal ideas. The patient is not nervous/anxious.     There were no vitals taken for this visit.There is no height or weight on file to calculate BMI.  General Appearance: Casual  Eye Contact:  Good  Speech:  Clear and Coherent  Volume:  Normal  Mood:  Euthymic  Affect:  Appropriate  Thought Process:  Coherent  Orientation:  Full (Time, Place, and Person)  Thought Content: Logical   Suicidal Thoughts:  No  Homicidal Thoughts:  No  Memory:  Immediate;   Good Recent;   Good  Judgement:  Good  Insight:  Good  Psychomotor Activity:  Normal  Concentration:  Concentration: Good  Recall:  Good  Fund of Knowledge: Good  Language: Good  Akathisia:  No  Handed:    AIMS (if indicated): not done  Assets:  Communication Skills Desire for Improvement Housing Leisure  Time Resilience Social Support  ADL's:  Intact  Cognition: WNL  Sleep:  Good   Screenings: AUDIT    Flowsheet Row [facility]  Alcohol Use Disorder Identification Test Final Score (AUDIT) 0      GAD-7    Flowsheet Row Counselor from 03/29/2022 in [facility] Counselor from 10/26/2021 in [facility] Counselor from 08/26/2021 in [facility] Counselor from 07/29/2021 in [facility] Video Visit from 07/12/2021 in [facility]  Total GAD-7 Score 6 7 7 6 10       PHQ2-9    Flowsheet Row ED from 04/16/2022 in [facility] Counselor from 03/29/2022 in [facility] Counselor from 10/26/2021 in [facility] Counselor from 08/26/2021 in [facility] Video Visit from 07/12/2021 in [facility]  PHQ-2 Total Score 2 4 2 4 3   PHQ-9 Total Score 7 12 8 10 11       Flowsheet Row [facility] from 04/16/2022 in [facility] Counselor from 04/09/2021 in [facility]  C-SSRS RISK CATEGORY Low Risk No Risk No Risk        Assessment and Plan: Patient doing well. She is tolerating her medication regimen well. Patient is not endorsing symptoms of PTSD or social anxiety any longer. She feels more confident about regaining her independence. Patient MDD remains in partial remission.  MDD in partial remission PTSD-improved Social anxiety disorder-resolved - Continue Zoloft 200 mg daily - Continue hydroxyzine 25 mg nightly   F/u in 3 months Collaboration of Care: Collaboration of Care:   Patient/Guardian was advised Release of  Information must be obtained prior to any record release in order to collaborate their care with an outside provider. Patient/Guardian was advised if they have not already done so to contact the registration department to sign all necessary forms in order for [place] to release information regarding their care.   Consent: Patient/Guardian gives verbal consent for treatment and assignment of benefits for services provided during this visit. Patient/Guardian expressed understanding and agreed to proceed.   PGY-3 Lamar Sprinkles, MD 10/28/2022, 9:10 AM

## 2022-10-29 ENCOUNTER — Encounter (HOSPITAL_COMMUNITY): Payer: Self-pay | Admitting: Emergency Medicine

## 2022-10-29 ENCOUNTER — Telehealth (HOSPITAL_COMMUNITY): Payer: Self-pay | Admitting: Family Medicine

## 2022-10-29 ENCOUNTER — Ambulatory Visit (HOSPITAL_COMMUNITY)
Admission: EM | Admit: 2022-10-29 | Discharge: 2022-10-29 | Disposition: A | Discharge status: Home / Self Care | Payer: Medicaid Other | Attending: Family Medicine | Admitting: Family Medicine

## 2022-10-29 DIAGNOSIS — H5711 Ocular pain, right eye: Secondary | ICD-10-CM | POA: Diagnosis not present

## 2022-10-29 LAB — BASIC METABOLIC PANEL
Anion gap: 7 (ref 5–15)
BUN: 13 mg/dL (ref 6–20)
CO2: 23 mmol/L (ref 22–32)
Calcium: 9.1 mg/dL (ref 8.9–10.3)
Chloride: 106 mmol/L (ref 98–111)
Creatinine, Ser: 0.88 mg/dL (ref 0.44–1.00)
GFR, Estimated: >60 mL/min (ref >60)
Glucose, Bld: 100 mg/dL — ABNORMAL HIGH (ref 70–99)
Potassium: 3.9 mmol/L (ref 3.5–5.1)
Sodium: 136 mmol/L (ref 135–145)

## 2022-10-29 LAB — CBC
HCT: 38.7 % (ref 36.0–46.0)
Hemoglobin: 13.3 g/dL (ref 12.0–15.0)
MCH: 30 pg (ref 26.0–34.0)
MCHC: 34.4 g/dL (ref 30.0–36.0)
MCV: 87.4 fL (ref 80.0–100.0)
Platelets: 234 10*3/uL (ref 150–400)
RBC: 4.43 MIL/uL (ref 3.87–5.11)
RDW: 12.1 % (ref 11.5–15.5)
WBC: 5.8 10*3/uL (ref 4.0–10.5)
nRBC: 0 % (ref 0.0–0.2)

## 2022-10-29 MED ORDER — OLOPATADINE HCL 0.1 % OP SOLN: 1.0000 [drp] | Freq: Two times a day (BID) | OPHTHALMIC | 0 refills | Status: DC

## 2022-10-29 NOTE — Telephone Encounter (Signed)
HIPAA compliant callback message left.  Please advise her that her lab test results were  benign. No anemia and kidnesy look great.

## 2022-10-29 NOTE — ED Provider Notes (Signed)
[facility]    CSN: [id number] Arrival date & time: 10/29/22  1342      History   Chief Complaint Chief Complaint  Patient presents with   Eye Pain    HPI Tiffany [name] is a 22 y.o. female.   The history is provided by the patient. No language interpreter was used.  Eye Pain This is a new problem. Episode onset: Right eye irritation. Feels like there is something in there. Was seen by her ophthalmologist  2 years ago for dry eye and was placed on eye drops for that. Symptoms now is different. Episode frequency: Waxe and wane. Associated symptoms comments: Sometimes it feels like FB sensation, but most times does not feel like FB sensation. Denies any exposure in terms of particles or fly getting into her eyes. No eye discharge, no vision change. Feels better when closed. . Nothing aggravates the symptoms. Relieved by: Warm wash cloth and Restasis. The treatment provided mild relief.  She also requested blood test given hx of anemia and chronic diseases in her family. Currently asymptomatic  Past Medical History:  Diagnosis Date   ADHD (attention deficit hyperactivity disorder) 2004   Took Adderall for several years, then Dexedrine, now Strattera   Asthma "since infancy"   triggers more in winter, has had bronchitis/pneumonia   Dyslexia    Mood disorder (HCC) 2013    Patient Active Problem List   Diagnosis Date Noted   MDD (major depressive disorder), recurrent, in partial remission (HCC) 07/01/2022   GAD (generalized anxiety disorder) 04/18/2022   PTSD (post-traumatic stress disorder) 04/18/2022   MDD (major depressive disorder), recurrent episode, severe (HCC) 04/17/2022   Mild depression 07/12/2021   Bilateral hip pain 04/20/2015   Adjustment disorder with mixed anxiety and depressed mood 04/14/2014   History of self-harm 04/14/2014   Dry eyes 01/08/2014   Dyslexia 12/06/2013   Dysmenorrhea in adolescent 11/12/2013   Surveillance of previously prescribed  contraceptive method 11/12/2013   ADHD (attention deficit hyperactivity disorder) 09/04/2013   Asthma, chronic 09/04/2013    Past Surgical History:  Procedure Laterality Date   ADENOIDECTOMY     OTHER SURGICAL HISTORY     tubes in the ears   TONSILLECTOMY     TYMPANOSTOMY TUBE PLACEMENT      OB History   No obstetric history on file.      Home Medications    Prior to Admission medications   Medication Sig Start Date End Date Taking? Authorizing Provider  olopatadine (PATANOL) 0.1 % ophthalmic solution Place 1 drop into the right eye 2 (two) times daily. 10/29/22  Yes Doreene Eland, MD  albuterol (VENTOLIN HFA) 108 (90 Base) MCG/ACT inhaler Inhale 1-2 puffs into the lungs every 6 (six) hours as needed for wheezing or shortness of breath. 04/21/22   Massengill, Harrold Donath, MD  hydrOXYzine (ATARAX) 25 MG tablet Take 1 tablet (25 mg total) by mouth at bedtime as needed for anxiety. 10/28/22   Nelly Rout, MD  sertraline (ZOLOFT) 100 MG tablet Take 2 tablets (200 mg total) by mouth daily. 10/28/22   Nelly Rout, MD    Family History Family History  Problem Relation Age of Onset   Learning disabilities Mother    Mental illness Mother    Drug abuse Mother    Learning disabilities Brother    Diabetes Maternal Uncle    Heart disease Maternal Grandmother    Hypertension Maternal Grandmother    Learning disabilities Maternal Grandmother    Depression Maternal  Grandmother    Anxiety disorder Maternal Grandmother    Learning disabilities Maternal Grandfather    Heart murmur Maternal Grandfather    Alcohol abuse Neg Hx    Clotting disorder Neg Hx    Mental illness Father    Heart disease Father     Social History Social History   Tobacco Use   Smoking status: Never    Passive exposure: Current   Smokeless tobacco: Never   Tobacco comments:    Both grandparents "chain smokers;" mom vapes  Substance Use Topics   Alcohol use: Never   Drug use: Never      Allergies   Augmentin [amoxicillin-pot clavulanate], Bee pollen, Latex, and Penicillins   Review of Systems Review of Systems  Eyes:  Positive for pain.     Physical Exam Triage Vital Signs ED Triage Vitals  Encounter Vitals Group     BP 10/29/22 1411 108/72     Systolic BP Percentile --      Diastolic BP Percentile --      Pulse Rate 10/29/22 1411 76     Resp 10/29/22 1411 18     Temp 10/29/22 1411 98 F (36.7 C)     Temp Source 10/29/22 1411 Oral     SpO2 10/29/22 1411 96 %     Weight --      Height --      Head Circumference --      Peak Flow --      Pain Score 10/29/22 1409 0     Pain Loc --      Pain Education --      Exclude from Growth Chart --    No data found.  Updated Vital Signs BP 108/72 (BP Location: Left Arm)   Pulse 76   Temp 98 F (36.7 C) (Oral)   Resp 18   LMP 10/20/2022   SpO2 96%   Visual Acuity Right Eye Distance:   Left Eye Distance:   Bilateral Distance:    Right Eye Near:   Left Eye Near:    Bilateral Near:     Physical Exam Vitals and nursing note reviewed.  Eyes:     General: No scleral icterus.       Right eye: No discharge.        Left eye: No discharge.     Extraocular Movements: Extraocular movements intact.     Conjunctiva/sclera: Conjunctivae normal.     Pupils: Pupils are equal, round, and reactive to light.     Comments: Wood lamp eval with fluorescein stain completed with no FB observed Normal visual acuity with finger counting (with corrected lens)  Cardiovascular:     Rate and Rhythm: Normal rate and regular rhythm.     Pulses: Normal pulses.     Heart sounds: Normal heart sounds. No murmur heard. Pulmonary:     Effort: Pulmonary effort is normal. No respiratory distress.     Breath sounds: Normal breath sounds. No wheezing.      UC Treatments / Results  Labs (all labs ordered are listed, but only abnormal results are displayed) Labs Reviewed  BASIC METABOLIC PANEL  CBC     EKG   Radiology No results found.  Procedures Procedures (including critical care time)  Medications Ordered in UC Medications - No data to display  Initial Impression / Assessment and Plan / UC Course  I have reviewed the triage vital signs and the nursing notes.  Pertinent labs & imaging results that were available  during my care of the patient were reviewed by me and considered in my medical decision making (see chart for details).  Clinical Course as of 10/29/22 1520  Sat Oct 29, 2022  1519 Eye irritation Normal eye exam Wood lamp + Fluorescein eval completed and was negative for FB ?? Allergic reaction Olopatadine escribed F/U with Ophthalmologist for reassessment discussed She agreed with the plan [KE]  1520 Family hx of anemia and chronic condition As discussed with her and her mother, no indication for testing However, they insisted on getting tested CBC & Bmet checked I will call her with the results [KE]    Clinical Course User Index [KE] Doreene Eland, MD    Final Clinical Impressions(s) / UC Diagnoses   Final diagnoses:  Acute right eye pain     Discharge Instructions      It was nice seeing you. I am sorry about your eye symptoms. I was unable to find any foreign object in your eye. This could be due to allergy. I sent in Olopatadine to your pharmacy for this. Please contact your eye doctor on Monday to be seen if symptoms persists. Blood test done as requested. I will call you with the result.     ED Prescriptions     Medication Sig Dispense Auth. Provider   olopatadine (PATANOL) 0.1 % ophthalmic solution Place 1 drop into the right eye 2 (two) times daily. 5 mL Doreene Eland, MD      PDMP not reviewed this encounter.   Doreene Eland, MD 10/29/22 1521    Doreene Eland, MD 10/30/22 [phone]

## 2022-10-29 NOTE — Discharge Instructions (Addendum)
It was nice seeing you. I am sorry about your eye symptoms. I was unable to find any foreign object in your eye. This could be due to allergy. I sent in Olopatadine to your pharmacy for this. Please contact your eye doctor on Monday to be seen if symptoms persists. Blood test done as requested. I will call you with the result.

## 2022-10-29 NOTE — ED Triage Notes (Signed)
Pt c/o right eye burning for about 3 days. Went to eye doctor who prescribed eye drops before (but now expired) but wanted $80 down payment so came here to be seen.

## 2022-10-30 ENCOUNTER — Other Ambulatory Visit (INDEPENDENT_AMBULATORY_CARE_PROVIDER_SITE_OTHER): Payer: Self-pay | Admitting: Family Medicine

## 2022-11-02 ENCOUNTER — Encounter (HOSPITAL_COMMUNITY): Payer: Self-pay | Admitting: Student

## 2022-11-07 ENCOUNTER — Ambulatory Visit (HOSPITAL_COMMUNITY): Payer: Medicaid Other | Admitting: Clinical

## 2022-11-07 DIAGNOSIS — F3341 Major depressive disorder, recurrent, in partial remission: Secondary | ICD-10-CM

## 2022-11-07 NOTE — Progress Notes (Signed)
   THERAPIST PROGRESS NOTE  Session Time: 45 minutes  Participation Level: Active  Behavioral Response: CasualAlertEuthymic  Type of Therapy: Individual Therapy  Treatment Goals addressed:  Tiffany [name] WILL IDENTIFY 3 COGNITIVE PATTERNS AND BELIEFS THAT SUPPORT DEPRESSION   ProgressTowards Goals: Progressing  Interventions: CBT  Summary:  Tiffany [name] is a 22 y.o. female who presents to the scheduled appointment oriented x 5, appropriately dressed, and friendly.  Client denied hallucinations and delusions. Client reported on today she has been doing pretty well.  Client reported she has been able to spend more quality time with her boyfriend be applying online video games together.  Client reported recently she did make posts on social media about missing her father and her thoughts about life.  Client reported she did get some feedback from others about her comments.  Client reported the post about her biological father was triggered by seeing her ex stepfather posting about "people leaving when they no longer need you".  Client reported the holiday season tends to be tough for her because she does miss the components of having clients and other family members when her mother was married.  Client reported she used to blame herself for why she did not have a consistent father figure in her life.  Client reported she will also not be visiting or her boyfriend will not be coming to see her this holiday season.  Client reported she will plan to do some things with her mother and brother.  Client reported she has up-and-down days with depression.  Client reported when everything that she struggles with is her body image.  Client reported she is also going to make a doctor's appointment about some concerns that she has. Evidence of progress towards goal: Client reported 1 trigger for depression can be related to the passing of her biological father.  Suicidal/Homicidal: Nowithout  intent/plan  Therapist Response:  Therapist began the appointment asking the client how she has been doing since last seen. Therapist used CBT to engage using active listening and positive emotional support. Therapist used CBT to ask the client open-ended questions about the severity of depression and/or anxiety symptoms over the past month and contributing factors. Therapist used CBT to normalize the clients and discuss reframing negative thoughts pertaining to family and her perceived progress at her current age. Therapist used CBT to positively reinforce the clients follow-through with taking care of her physical health. Therapist used CBT ask the client to identify her progress with frequency of use with coping skills with continued practice in her daily activity.       Plan: Return again in 4 weeks.  Diagnosis: major depressive disorder, recurrent , in partial remission  Collaboration of Care: Patient refused AEB none requested by the client.  Patient/Guardian was advised Release of Information must be obtained prior to any record release in order to collaborate their care with an outside provider. Patient/Guardian was advised if they have not already done so to contact the registration department to sign all necessary forms in order for [place] to release information regarding their care.   Consent: Patient/Guardian gives verbal consent for treatment and assignment of benefits for services provided during this visit. Patient/Guardian expressed understanding and agreed to proceed.   Tiffany [name] Tiffany Latella, LCSW 11/07/2022

## 2022-11-29 ENCOUNTER — Encounter (HOSPITAL_COMMUNITY): Payer: Self-pay

## 2022-11-29 ENCOUNTER — Ambulatory Visit (INDEPENDENT_AMBULATORY_CARE_PROVIDER_SITE_OTHER): Payer: Medicaid Other | Admitting: Clinical

## 2022-11-29 DIAGNOSIS — F3341 Major depressive disorder, recurrent, in partial remission: Secondary | ICD-10-CM

## 2022-11-29 NOTE — Progress Notes (Signed)
   THERAPIST PROGRESS NOTE  Session Time: 40 minutes  Participation Level: Active  Behavioral Response: CasualAlertDepressed  Type of Therapy: Individual Therapy  Treatment Goals addressed: : Soledad WILL COMPLETE AT LEAST 80% OF ASSIGNED HOMEWORK   ProgressTowards Goals: Progressing  Interventions: CBT and Supportive  Summary:  [name] is a 22 y.o. female who presents for the scheduled appointment oriented x 5, appropriately dressed, friendly.  Client denied hallucinations and delusions. Client reported on today he has been up to date for her.  Client reported that the negative has occurred just 1 of those things because she is not feeling in the best of mood.  Client reported however she has been feeling anxious about the upcoming holiday season having to spend it with her family.  Client reported she will not be visited by her boyfriend this year so she will not be able to break away from them.  Client reported she has been up around Christmas tree with the things that she likes.  Client reported there is usually a lot of arguing and the holidays which is why she likes to play with approximately.  Client reported watching the Thanksgiving day parade usually helps her to get through the upcoming holiday as a nice thing to watch.  Client reported otherwise she has been trying to enjoy her time at home by making Christmas tree decorations and decorating the house. Evidence of progress towards goal: Client reported utilizing 2 positive behavioral activities to help her cope during a stressful time of the holidays.   Suicidal/Homicidal: Nowithout intent/plan  Therapist Response:  Therapist began the appointment asking the client how she has been doing since last seen. Therapist used CBT to engage using active listening and positive emotional support. Therapist used CBT to engage and asked the client what is contributing to negative emotions today and him during the holiday  season. Therapist used CBT to normalize the clients thoughts and to brainstorm how she can positively work through with the upcoming holidays. Therapist used CBT ask the client to identify her progress with frequency of use with coping skills with continued practice in her daily activity.    Therapist assigned the client homework to practice self care.   Plan: Return again in 4 weeks.  Diagnosis: major depressive disorder, recurrent, in partial remission  Collaboration of Care: Patient refused AEB none requested by the client.  Patient/Guardian was advised Release of Information must be obtained prior to any record release in order to collaborate their care with an outside provider. Patient/Guardian was advised if they have not already done so to contact the registration department to sign all necessary forms in order for [place] to release information regarding their care.   Consent: Patient/Guardian gives verbal consent for treatment and assignment of benefits for services provided during this visit. Patient/Guardian expressed understanding and agreed to proceed.   [name] Ameri Cahoon, LCSW 11/29/2022

## 2022-12-12 ENCOUNTER — Ambulatory Visit (INDEPENDENT_AMBULATORY_CARE_PROVIDER_SITE_OTHER): Payer: Medicaid Other | Admitting: Clinical

## 2022-12-12 DIAGNOSIS — F3341 Major depressive disorder, recurrent, in partial remission: Secondary | ICD-10-CM | POA: Diagnosis not present

## 2022-12-12 NOTE — Progress Notes (Signed)
  THERAPIST PROGRESS NOTE  Session Time: 45 minutes  Participation Level: Active  Behavioral Response: CasualAlertEuthymic  Type of Therapy: Individual Therapy  Treatment Goals addressed:  Tiffany [name] WILL COMPLETE AT LEAST 80% OF ASSIGNED HOMEWORK   ProgressTowards Goals: Progressing  Interventions: CBT and Supportive  Summary:  Tiffany [name] is a 22 y.o. female who presents for scheduled appointment oriented x 5, appropriately dressed, and friendly.  Client denied hallucinations and delusions. Client reported on today she is doing well.  Client reported the Thanksgiving holiday went fairly well.  Client reported the usual behaviors of bickering between her grandparents usually occurs but she was able to offset that.  Client reported she enjoys helping her grandfather cook for the holiday.  Client reported the holiday this year is making her sad because of her cat who passed away.  Client reported she had her cat since childhood and the cat had served as a positive emotional support through the trauma that she went through.  Client reported she has kept some of mementos dose of him but she has recognized that she does not need to hold onto everything that she knows that her cat like because that would not be good for her in the long run.  Evidence of progress towards goal:     Suicidal/Homicidal: Nowithout intent/plan  Therapist Response:  Therapist began the appointment asking the client how she has been doing since last seen. Therapist used cbt to engage using active listening and positive emotional support. Therapist used cbt to engage and ask the client how she was able to cope with the holiday. Therapist used cbt to engage and normalize her thoughts on grief and being positive during the holiday season. Therapist used cbt to engage discuss how to create a nice environment and coping skills to manage depression during the holiday season. Therapist used CBT ask the client to identify  her progress with frequency of use with coping skills with continued practice in her daily activity.      Plan: Return again in 4 weeks.  Diagnosis: mdd, recurrent in partial remission  Collaboration of Care: Patient refused AEB none  Patient/Guardian was advised Release of Information must be obtained prior to any record release in order to collaborate their care with an outside provider. Patient/Guardian was advised if they have not already done so to contact the registration department to sign all necessary forms in order for [place] to release information regarding their care.   Consent: Patient/Guardian gives verbal consent for treatment and assignment of benefits for services provided during this visit. Patient/Guardian expressed understanding and agreed to proceed.   [name] Jhovanny Guinta, LCSW 12/12/2022

## 2022-12-21 ENCOUNTER — Ambulatory Visit (HOSPITAL_COMMUNITY): Payer: Medicaid Other | Admitting: Clinical

## 2022-12-27 ENCOUNTER — Ambulatory Visit (INDEPENDENT_AMBULATORY_CARE_PROVIDER_SITE_OTHER): Payer: Medicaid Other | Admitting: Clinical

## 2022-12-27 DIAGNOSIS — F3341 Major depressive disorder, recurrent, in partial remission: Secondary | ICD-10-CM

## 2022-12-27 NOTE — Progress Notes (Signed)
   THERAPIST PROGRESS NOTE Virtual Visit via Video Note  I connected with Tiffany [name] on 12/27/2022 at  9:00 AM EST by a video enabled telemedicine application and verified that I am speaking with the correct person using two identifiers.  Location: Patient: home Provider: office   I discussed the limitations of evaluation and management by telemedicine and the availability of in person appointments. The patient expressed understanding and agreed to proceed.   Follow Up Instructions: I discussed the assessment and treatment plan with the patient. The patient was provided an opportunity to ask questions and all were answered. The patient agreed with the plan and demonstrated an understanding of the instructions.   The patient was advised to call back or seek an in-person evaluation if the symptoms worsen or if the condition fails to improve as anticipated.   Session Time: 20 minutes  Participation Level: Active  Behavioral Response: CasualAlertEuthymic  Type of Therapy: Individual Therapy  Treatment Goals addressed: Aimy WILL IDENTIFY 3 COGNITIVE PATTERNS AND BELIEFS THAT SUPPORT DEPRESSION   ProgressTowards Goals: Progressing  Interventions: CBT and Supportive  Summary:  Tiffany [name] is a 22 y.o. female who presents to the appointment oriented times five, appropriately dressed and friendly. Client denied hallucinations and delusions. Client reported on today she is doing well.  Client reported she did not she was last seen she had a good Thanksgiving with her family.  Client reported it was not as bad as she thought.  Client reported preparing for Christmas she is hoping that things will be calm between her family as they usually argue.  Client reported she is sad that she will not be spending the holiday with her boyfriend that she. Evidence of progress towards goal: Client reported 1 positive of engaging in positive activities during the week.  Suicidal/Homicidal:  Nowithout intent/plan  Therapist Response:  Therapist began the appointment asking the client how she has been doing since last seen. Therapist used CBT to engage with active listening and positive emotional support. Therapist used CBT to ask client how she is coping and allergy season. Therapist used CBT to engage the client and continue to teach her about reframing negative thoughts. Therapist used CBT ask the client to identify her progress with frequency of use with coping skills with continued practice in her daily activity.    Therapist assigned client homework to practice self-care.   Plan: Return again in 4 weeks.  Diagnosis: Major depressive disorder, recurrent episode, in partial remission  Collaboration of Care: client reported no other needs.  Patient/Guardian was advised Release of Information must be obtained prior to any record release in order to collaborate their care with an outside provider. Patient/Guardian was advised if they have not already done so to contact the registration department to sign all necessary forms in order for [place] to release information regarding their care.   Consent: Patient/Guardian gives verbal consent for treatment and assignment of benefits for services provided during this visit. Patient/Guardian expressed understanding and agreed to proceed.   [name] Garnell Begeman, LCSW 12/27/2022

## 2023-01-10 IMAGING — DX DG ANKLE COMPLETE 3+V*R*
3 series · 3 of 3 positions shown · non-contrast
Comparison: None.

CLINICAL DATA: Right ankle pain after multiple falls.

EXAM:
RIGHT ANKLE - COMPLETE 3+ VIEW

[ankle ap]
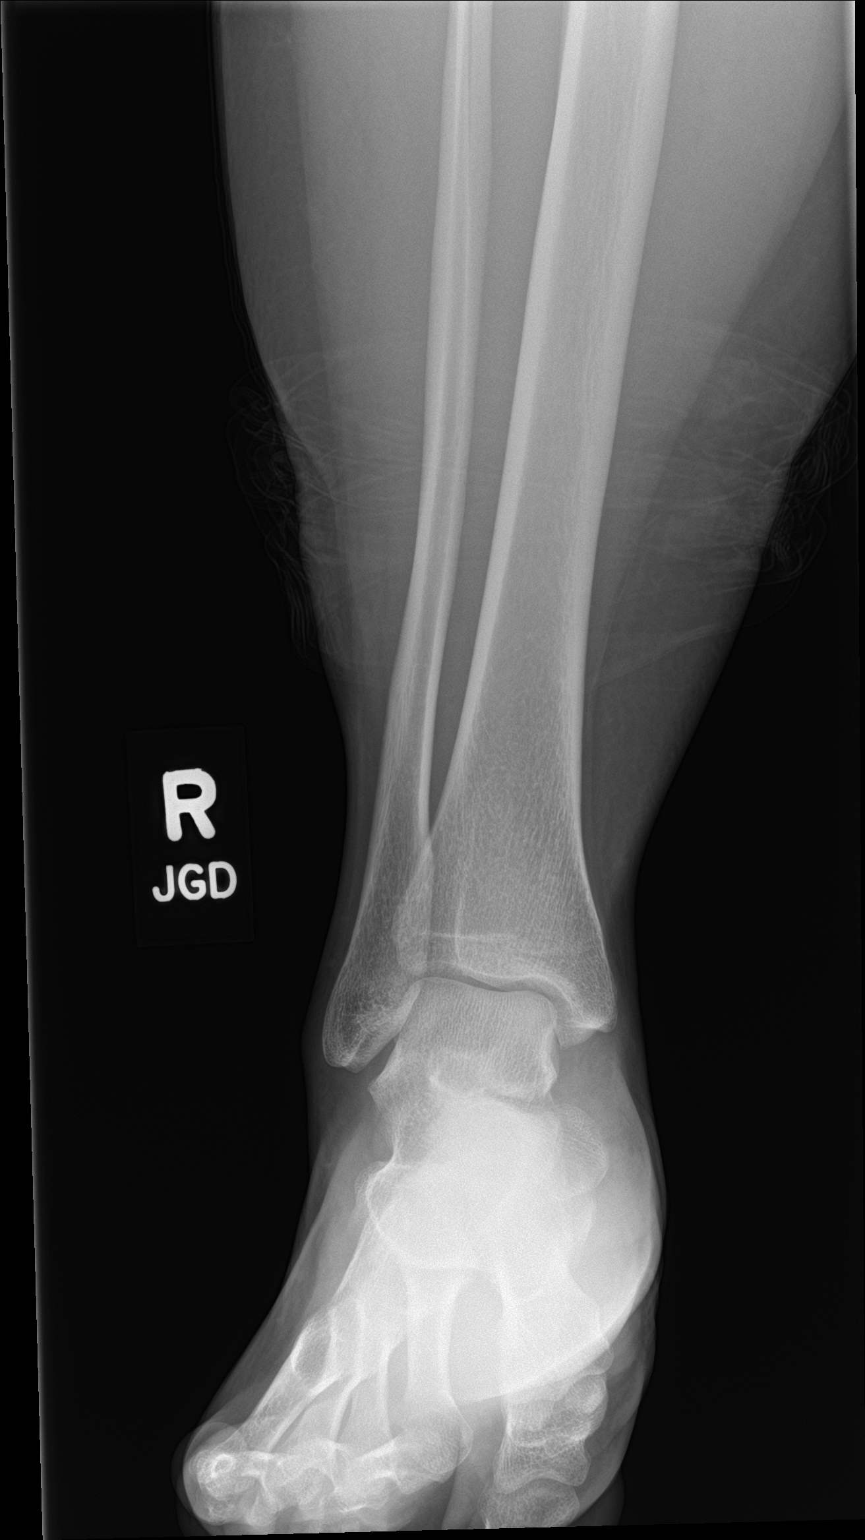

[ankle obl]
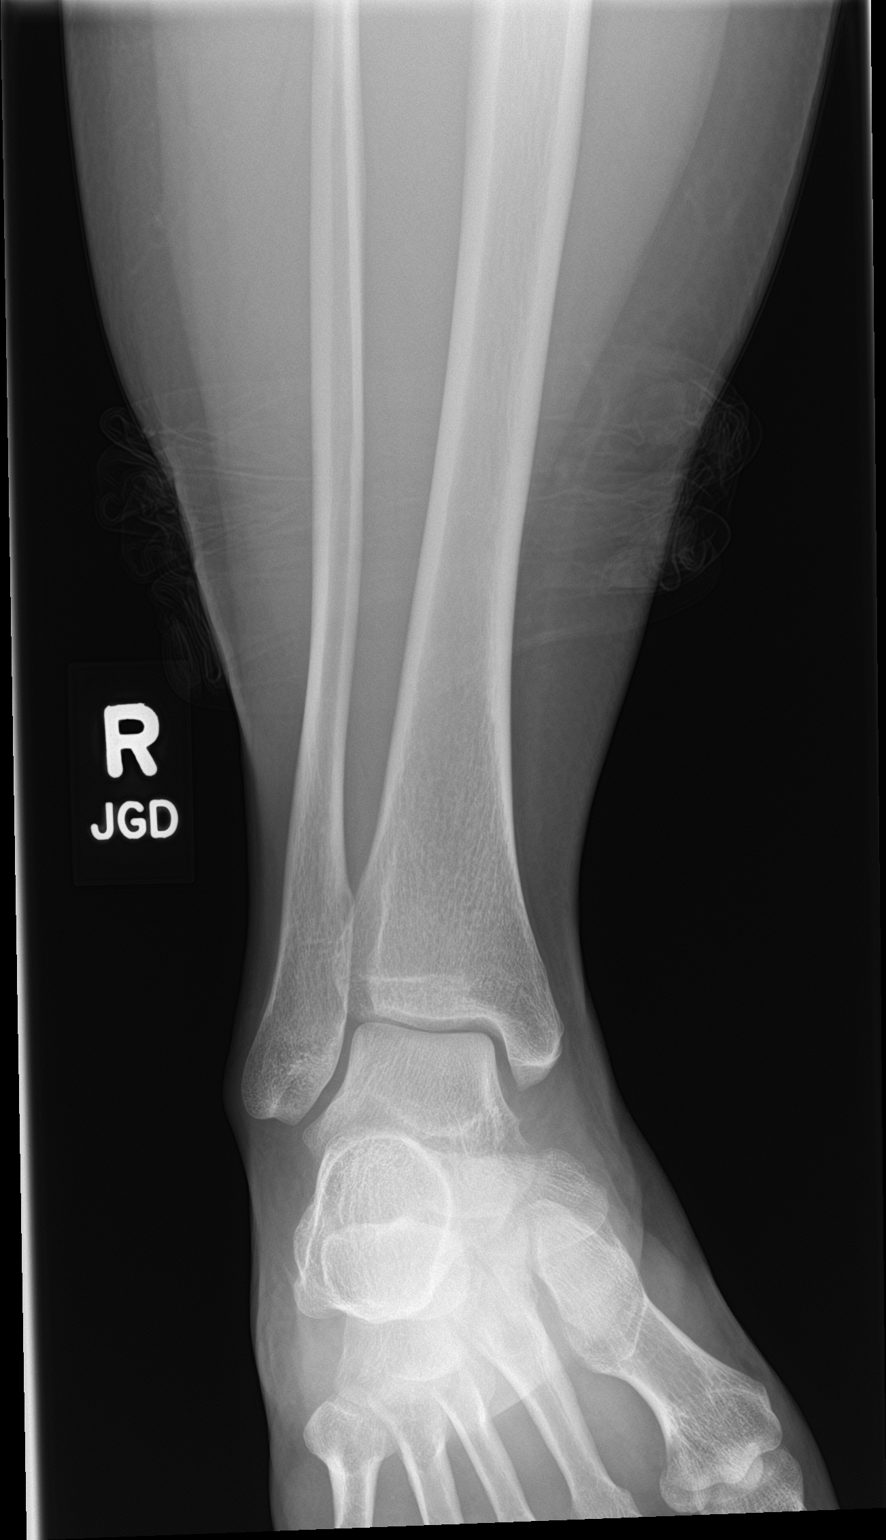

[ankle lat]
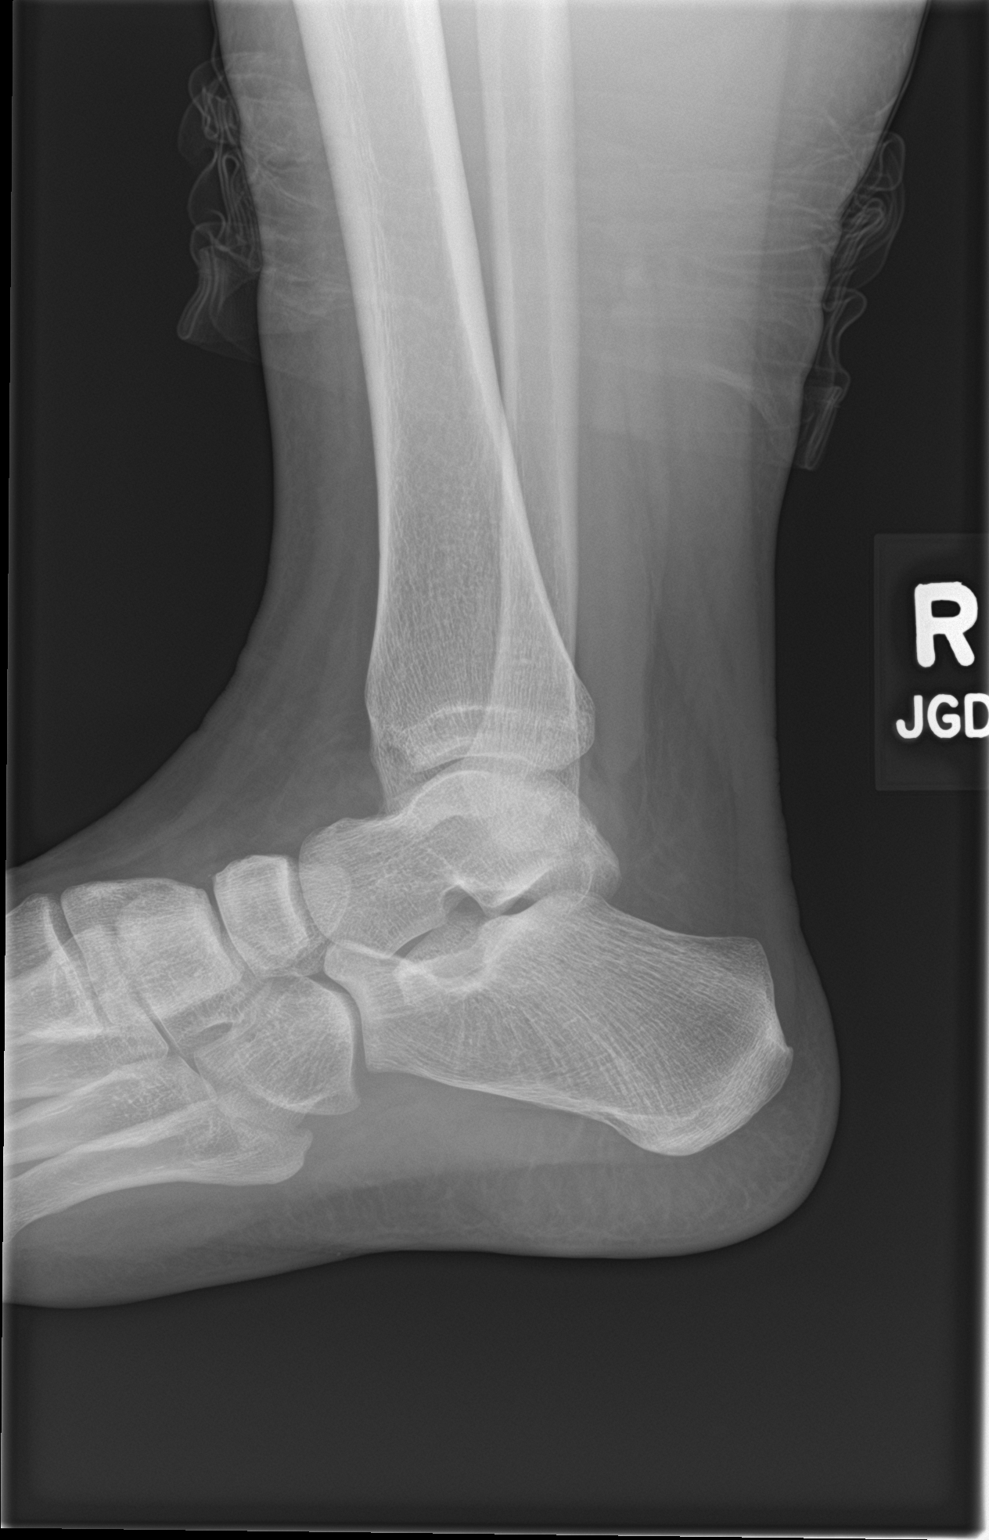

[3 of 3 positions shown; findings below may reference images not displayed]

FINDINGS: No acute fracture or dislocation. Base of fifth metatarsal and talar
dome intact.
IMPRESSION: No acute osseous abnormality.

## 2023-01-20 ENCOUNTER — Encounter (HOSPITAL_COMMUNITY): Payer: Medicaid Other | Admitting: Student

## 2023-01-23 ENCOUNTER — Ambulatory Visit (INDEPENDENT_AMBULATORY_CARE_PROVIDER_SITE_OTHER): Payer: Medicaid Other | Admitting: Clinical

## 2023-01-23 DIAGNOSIS — F3341 Major depressive disorder, recurrent, in partial remission: Secondary | ICD-10-CM

## 2023-01-23 NOTE — Progress Notes (Signed)
   THERAPIST PROGRESS NOTE  Session Time: 45 minutes  Participation Level: Active  Behavioral Response: CasualAlertEuthymic  Type of Therapy: Individual Therapy  Treatment Goals addressed: [name] WILL IDENTIFY 3 COGNITIVE PATTERNS AND BELIEFS THAT SUPPORT DEPRESSION   ProgressTowards Goals: Progressing  Interventions: CBT  Summary:  Tiffany [name] is a 23 y.o. female who presents for the scheduled appointment oriented x 5, appropriately dressed, and friendly.  Client denied hallucinations and delusions. Client reported on today she is feeling fairly okay.  Client reported the holiday season was a bit stressful due to anticipated family stressors. Client reported her grandmother can be a trigger for irritability.  Client reported her grandma tries to be manipulative and is very self absorbed with out considering how other people are affected by her actions or what they have going on in the house.  Client reported she spends most of her time in her room to herself because she wants to.  Client reported she has been worried about her grandfather who seems to be going through some health concerns that he is not addressing.  Client reported likewise she is worried about her mothers health and her own.  Client reported she has some checkups that she has never done before.  Client reported they are all nervous about going to the doctor because of what could be wrong.  Client reported she has had symptoms of an irregular heartbeat that have been ongoing for some years. Client reported she also is nervous about going to the doctor because of previous results that she has been through and feeling sensitive to being examined.  Client reported she is also been thinking about her future with her boyfriend.  Client reported she is trying to figure out if she loves him or if she just has a attachment to him. Evidence of progress towards goal: Client reported 2 cognitive patterns related from childhood  experiences that relate to anxiety and depressive symptoms currently.  Suicidal/Homicidal: Nowithout intent/plan  Therapist Response:  Therapist began the appointment asking the client how she has been doing. Therapist used cbt to engage with active listening and positive emotional support. Therapist used cbt to engage and ask about changes that have occurred and/or thoughts she has difficulty processing. Therapist used cbt to engage continue teaching the client to use boundaries and assertive communication appropriately. Therapist used CBT ask the client to identify her progress with frequency of use with coping skills with continued practice in her daily activity.  Therapist assigned the client homework to practice self care.   Plan: Return again in 4 weeks.  Diagnosis: mdd, recurrent, in partial remission  Collaboration of Care: Patient refused AEB none requested by the client.  Patient/Guardian was advised Release of Information must be obtained prior to any record release in order to collaborate their care with an outside provider. Patient/Guardian was advised if they have not already done so to contact the registration department to sign all necessary forms in order for [place] to release information regarding their care.   Consent: Patient/Guardian gives verbal consent for treatment and assignment of benefits for services provided during this visit. Patient/Guardian expressed understanding and agreed to proceed.   [name] Annelisa Ryback, LCSW 01/23/2023

## 2023-01-25 ENCOUNTER — Encounter (HOSPITAL_COMMUNITY): Payer: Medicaid Other | Admitting: Student

## 2023-01-26 ENCOUNTER — Encounter (HOSPITAL_COMMUNITY): Payer: Self-pay | Admitting: Student

## 2023-01-26 ENCOUNTER — Ambulatory Visit (INDEPENDENT_AMBULATORY_CARE_PROVIDER_SITE_OTHER): Payer: Medicaid Other | Admitting: Student

## 2023-01-26 VITALS — BP 115/91 | HR 84 | Ht 62.0 in | Wt 194.0 lb

## 2023-01-26 DIAGNOSIS — F5081 Binge eating disorder, mild: Secondary | ICD-10-CM | POA: Diagnosis not present

## 2023-01-26 DIAGNOSIS — F4310 Post-traumatic stress disorder, unspecified: Secondary | ICD-10-CM | POA: Diagnosis not present

## 2023-01-26 DIAGNOSIS — F330 Major depressive disorder, recurrent, mild: Secondary | ICD-10-CM | POA: Diagnosis not present

## 2023-01-26 MED ORDER — FLUOXETINE HCL 10 MG PO CAPS: 10.0000 mg | ORAL_CAPSULE | Freq: Every day | ORAL | 1 refills | Status: DC

## 2023-01-26 MED ORDER — SERTRALINE HCL 50 MG PO TABS
50.0000 mg | ORAL_TABLET | Freq: Every day | ORAL | 0 refills | Status: DC
Start: 2023-01-26 — End: 2023-03-01

## 2023-01-26 MED ORDER — HYDROXYZINE HCL 25 MG PO TABS
25.0000 mg | ORAL_TABLET | Freq: Every evening | ORAL | 3 refills | Status: DC | PRN
Start: 2023-01-26 — End: 2023-03-01

## 2023-01-26 NOTE — Progress Notes (Cosign Needed)
[facility] MD/PA/NP OP Progress Note  01/26/2023   9:29 AM Tiffany [name]  MRN:  [id number]  Chief Complaint:  Chief Complaint  Patient presents with   Follow-up   Medication Problem   HPI: Tiffany [name] is a 23 year old patient with a history of mild depression, PTSD, adjustment disorder, ADHD, and dyslexia.  Patient reports she has been compliant with the following medication regimen:    Zoloft 200mg  daily Hydroxyzine 25mg  TID PRN   Patient presents with mom today and reports that things have been "a rollercoaster." She has been sleeping more than normal, with sleep during the day and difficulty sleeping at night.   More days of irritable mood. Low energy. Appetite is poor; eats one meal and may snack but this is not consistent. She noticed sedation on Zoloft 200 mg. She has not been as depressed, since starting the medication, but feels more tired.  Eating binges. Hx of starving self when younger which became binges when older. Binges include 2 dozen doughnuts. Felt bad but no compensatory behaviors.   Mom reports uncertainty about moods. She has noted that Tiffany [name] has been emotionally numbed.   Requires PRN hydroxyzine only when going out in public.   She sometimes has passive SI (maybe my family would be better off without me), but denies intent to act on them. She writes it out, and it improves. She denies HI and AVH.   Secondhand smoke from both grandparents, mom vapes, denies personal use of tobacco products, alcohol, and illicit substances.    Visit Diagnosis:    ICD-10-CM   1. PTSD (post-traumatic stress disorder)  F43.10 hydrOXYzine (ATARAX) 25 MG tablet    sertraline (ZOLOFT) 50 MG tablet    2. MDD (major depressive disorder), recurrent episode, mild (HCC)  F33.0 hydrOXYzine (ATARAX) 25 MG tablet         Past Psychiatric History: ADHD, adjustment disorder, anxiety, depression, and dyslexia.    Last visit:  10/12/2021-patient Zoloft increased to 150 mg to address  PTSD symptoms.   12/2021-patient working on becoming more independent in the grocery store, patient was also getting ready to have an upcoming trip with her boyfriend.  No medication adjustments were made as patient appeared to be benefiting on current dose.   03/2022- Patient's hypervigilance has significantly decreased and she is slowly becoming more independent. Patient also appears to be exhibiting less reliance on mom during assessments.    Update: 04/2022- Patient hospitalized after finding out her boyfriend of 9 years, that lives in [place] has gotten another girl pregnant and she has been feeling very depressed and suicidal with thoughts of overdosing on pills.   05/2022-Patient is using coping skills and engaging well with therapy. Stabilized and doing well. No med adjustments. ignificant decrease in social anxiety.    Past Medical History:  Past Medical History:  Diagnosis Date   ADHD (attention deficit hyperactivity disorder) 01/03/2002   Took Adderall for several years, then Dexedrine, now Strattera   Adjustment disorder with mixed anxiety and depressed mood 04/14/2014   Asthma "since infancy"   triggers more in winter, has had bronchitis/pneumonia   Dyslexia    MDD (major depressive disorder), recurrent episode, severe (HCC) 04/17/2022   Mild depression 07/12/2021   Mood disorder (HCC) 01/04/2011    Past Surgical History:  Procedure Laterality Date   ADENOIDECTOMY     OTHER SURGICAL HISTORY     tubes in the ears   TONSILLECTOMY     TYMPANOSTOMY TUBE PLACEMENT  Family Psychiatric History:  Medical: Reports grandma recently found cancerous polyps in her colon, history of heart problems.  Reports great grandma with seizures.  Reports family history of dementia Psych:  Mother dyslexia, eating disorder, substance use, anxiety  Brother dyslexia and bipolar, Maternal grandmother dyslexia, depression, anxiety  Psych Rx: Unknown SA/HA: Reports uncle committed  suicide Substance use family hx: Reports family history of substance use    Family History:  Family History  Problem Relation Age of Onset   Learning disabilities Mother    Mental illness Mother    Drug abuse Mother    Learning disabilities Brother    Diabetes Maternal Uncle    Heart disease Maternal Grandmother    Hypertension Maternal Grandmother    Learning disabilities Maternal Grandmother    Depression Maternal Grandmother    Anxiety disorder Maternal Grandmother    Learning disabilities Maternal Grandfather    Heart murmur Maternal Grandfather    Alcohol abuse Neg Hx    Clotting disorder Neg Hx    Mental illness Father    Heart disease Father     Social History:  Social History   Socioeconomic History   Marital status: Single    Spouse name: Not on file   Number of children: Not on file   Years of education: Not on file   Highest education level: Not on file  Occupational History   Not on file  Tobacco Use   Smoking status: Never    Passive exposure: Current   Smokeless tobacco: Never   Tobacco comments:    Both grandparents "chain smokers;" mom vapes  Substance and Sexual Activity   Alcohol use: Never   Drug use: Never   Sexual activity: Not Currently  Other Topics Concern   Not on file  Social History Narrative   Not on file   Social Drivers of Health   Financial Resource Strain: Not on file  Food Insecurity: No Food Insecurity (04/17/2022)   Hunger Vital Sign    Worried About Running Out of Food in the Last Year: Never true    Ran Out of Food in the Last Year: Never true  Transportation Needs: No Transportation Needs (04/17/2022)   PRAPARE - Administrator, Civil Service (Medical): No    Lack of Transportation (Non-Medical): No  Physical Activity: Not on file  Stress: Not on file  Social Connections: Not on file    Allergies:  Allergies  Allergen Reactions   Augmentin [Amoxicillin-Pot Clavulanate] Hives and Other (See Comments)         Bee Pollen    Latex    Penicillins Rash    Metabolic Disorder Labs: No results found for: "HGBA1C", "MPG" No results found for: "PROLACTIN" No results found for: "CHOL", "TRIG", "HDL", "CHOLHDL", "VLDL", "LDLCALC" Lab Results  Component Value Date   TSH 3.540 04/19/2022    Therapeutic Level Labs: No results found for: "LITHIUM" No results found for: "VALPROATE" No results found for: "CBMZ"  Current Medications: Current Outpatient Medications  Medication Sig Dispense Refill   FLUoxetine (PROZAC) 10 MG capsule Take 1-2 capsules (10-20 mg total) by mouth daily. Beginning 1/24, take 10 mg (1 capsule) daily x 14 days, then on 2/7 increase to 20 mg (2 capsules) daily. 42 capsule 1   albuterol (VENTOLIN HFA) 108 (90 Base) MCG/ACT inhaler Inhale 1-2 puffs into the lungs every 6 (six) hours as needed for wheezing or shortness of breath.     hydrOXYzine (ATARAX) 25 MG tablet Take 1  tablet (25 mg total) by mouth at bedtime as needed for anxiety. 30 tablet 3   olopatadine (PATANOL) 0.1 % ophthalmic solution Place 1 drop into the right eye 2 (two) times daily. 5 mL 0   sertraline (ZOLOFT) 50 MG tablet Take 1-3 tablets (50-150 mg total) by mouth at bedtime. Beginning 1/24, take 150 mg (3 tablets) nightly x 7 days, then on 1/31 decrease to 100 mg (2 tablets) nightly x 7 days, then on 2/7 decrease to 50 mg nightly (1 tablet) x 7 days, then discontinue medication. 42 tablet 0   No current facility-administered medications for this visit.     Musculoskeletal: Strength & Muscle Tone: within normal limits Gait & Station: normal Patient leans: N/A  Psychiatric Specialty Exam: Review of Systems  Psychiatric/Behavioral:  Negative for dysphoric mood, hallucinations, sleep disturbance and suicidal ideas. The patient is not nervous/anxious.     Blood pressure (!) 115/91, pulse 84, height 5\' 2"  (1.575 m), weight 194 lb (88 kg), SpO2 100%.Body mass index is 35.48 kg/m.  General Appearance:  Casual  Eye Contact:  Good  Speech:  Clear and Coherent  Volume:  Normal  Mood:  Euthymic  Affect:  Appropriate  Thought Process:  Coherent  Orientation:  Full (Time, Place, and Person)  Thought Content: Logical   Suicidal Thoughts:  No  Homicidal Thoughts:  No  Memory:  Immediate;   Good Recent;   Good  Judgement:  Good  Insight:  Good  Psychomotor Activity:  Normal  Concentration:  Concentration: Good  Recall:  Good  Fund of Knowledge: Good  Language: Good  Akathisia:  No  Handed:    AIMS (if indicated): not done  Assets:  Communication Skills Desire for Improvement Housing Leisure Time Resilience Social Support  ADL's:  Intact  Cognition: WNL  Sleep:  Good   Screenings: AUDIT    Flowsheet Row [facility]  Alcohol Use Disorder Identification Test Final Score (AUDIT) 0      GAD-7    Flowsheet Row Counselor from 03/29/2022 in [name] Counselor from 10/26/2021 in [facility] Counselor from 08/26/2021 in [name] Counselor from 07/29/2021 in [name] Video Visit from 07/12/2021 in [facility]  Total GAD-7 Score 6 7 7 6 10       PHQ2-9    Flowsheet Row ED from 04/16/2022 in [facility] Counselor from 03/29/2022 in [name] Counselor from 10/26/2021 in [facility] Counselor from 08/26/2021 in [name] Video Visit from 07/12/2021 in [facility]  PHQ-2 Total Score 2 4 2 4 3   PHQ-9 Total Score 7 12 8 10 11       Flowsheet Row ED from 10/29/2022 in [name] Ad[facility] (Discharged) [facility] from 04/16/2022 in [facility]  C-SSRS RISK CATEGORY No Risk Low Risk No Risk        Assessment and Plan: Patient reports an increase in her anxiety and depressive symptoms since last visit.  Particularly, she is worried about low mood, fatigue, daytime somnolence.  She notes these symptoms began to worsen with her Zoloft titrated to 200 mg in the morning.  As she has noted some benefit from the medication but with too much somnolence, we  will opt to switch medications but within the same SSRI class.  We will switch to Prozac at this time as it is more activating than the Zoloft.  Patient is agreeable with this plan.  She poses no safety concerns at this time, but safety planning is completed with her including reaching out to supports, 988, [facility], or and ED. She is receptive.   MDD in partial remission PTSD-improved Social anxiety disorder-mild - We will begin cross titration from Zoloft to Prozac:  -Decreased to Zoloft 150 mg x 1 week, then decrease to 100 mg x 1 week, then decrease to 50 mg x 1 week, then disnhcontinue  -Start Prozac 10 mg x 2 weeks, then increase to 20 mg - Continue hydroxyzine 25 mg nightly  Disordered Eating (h/o avoidant/restrictive eating patterns, current binge eating patterns) -- Will switch SSRI therapy to see if this provides some benefit.  -- Patient currently in therapy with Paige Cozart, LCSW   F/u in 4-6 weeks Collaboration of Care: Collaboration of Care: Dr. Josephina Shih  Patient/Guardian was advised Release of Information must be obtained prior to any record release in order to collaborate their care with an outside provider. Patient/Guardian was advised if they have not already done so to contact the registration department to sign all necessary forms in order for [place] to release information regarding their care.   Consent: Patient/Guardian gives verbal consent for treatment and assignment of benefits for services provided during this visit. Patient/Guardian  expressed understanding and agreed to proceed.   PGY-3 Lamar Sprinkles, MD 01/26/2023   9:29 AM

## 2023-02-23 ENCOUNTER — Telehealth (INDEPENDENT_AMBULATORY_CARE_PROVIDER_SITE_OTHER): Payer: Self-pay | Admitting: Student

## 2023-02-23 DIAGNOSIS — Z91199 Patient's noncompliance with other medical treatment and regimen due to unspecified reason: Secondary | ICD-10-CM

## 2023-02-23 NOTE — Progress Notes (Signed)
Patient sent links to email and phone number on file at 9:36 AM.  As well, called patient's cell phone number on file; unable to complete call.  Called patient's home phone number on file; no answer so left a voicemail.  Waited in the virtual waiting room for 5 additional minutes, and patient did not show.  Patient to reschedule appointment.   Lamar Sprinkles, MD PGY-3 02/23/2023  9:43 AM [facility]

## 2023-02-27 ENCOUNTER — Ambulatory Visit (HOSPITAL_COMMUNITY): Payer: Medicaid Other | Admitting: Clinical

## 2023-02-27 ENCOUNTER — Encounter (HOSPITAL_COMMUNITY): Payer: Self-pay

## 2023-02-27 DIAGNOSIS — F330 Major depressive disorder, recurrent, mild: Secondary | ICD-10-CM

## 2023-02-27 NOTE — Progress Notes (Signed)
   THERAPIST PROGRESS NOTE  Session Time: 40 minutes  Participation Level: Active  Behavioral Response: CasualAlertEuthymic  Type of Therapy: Individual Therapy  Treatment Goals addressed: [name] WILL SCORE LESS THAN 10 ON THE PATIENT HEALTH QUESTIONNAIRE (PHQ-9)   ProgressTowards Goals: Progressing  Interventions: CBT  Summary:  Tiffany [name] is a 23 y.o. female who presents for the scheduled appointment oriented times five, appropriately dressed and friendly. Client denied hallucinations and delusions. Client reported on today she is doing fairly okay. Client reported she was put on a new medication regimen. Client reported there have been some positive and negatives. Client reported she did experience a panic attack and she has not been able to fall asleep until early hours of the morning. Client reported the positive has been less depression, not sleeping all day, and feeling more energetic. Client reported otherwise she has had a concern for a friend who is going to the [employer]. Client reported she has been spending time doing activities around the house. Client reported her boyfriend sent a care package with some thoughtful activities and gifts. Client reported she also has other hobbies she is taking up for learning other languages and looking up history which has been of interests. Evidence of progress towards goal:  client PHQ-9 is a 5. Flowsheet Row Counselor from 02/27/2023 in [facility]  PHQ-9 Total Score 5        Suicidal/Homicidal: Nowithout intent/plan  Therapist Response:  Therapist began the appointment asking the client how she has been doing since last seen. Therapist used cbt to engage with active listening and positive emotional support. Therapist used cbt to engage and ask the client how she is responding to medication regimen compared to severity of her symptoms. Therapist completed updated SDOH. Therapist used cbt to engage with  her to normalize her emotions and to identify her positive strengths. Therapist used CBT ask the client to identify her progress with frequency of use with coping skills with continued practice in her daily activity.    Therapist assigned the client homework to practice self care.   Plan: Return again in 4 weeks.  Diagnosis: mdd, recurrent episode, mild  Collaboration of Care: Patient refused AEB none requested by the client.  Patient/Guardian was advised Release of Information must be obtained prior to any record release in order to collaborate their care with an outside provider. Patient/Guardian was advised if they have not already done so to contact the registration department to sign all necessary forms in order for [place] to release information regarding their care.   Consent: Patient/Guardian gives verbal consent for treatment and assignment of benefits for services provided during this visit. Patient/Guardian expressed understanding and agreed to proceed.   [name] Tadao Emig, LCSW 02/27/2023

## 2023-03-01 ENCOUNTER — Encounter (HOSPITAL_COMMUNITY): Payer: Self-pay | Admitting: Student

## 2023-03-01 ENCOUNTER — Ambulatory Visit (HOSPITAL_COMMUNITY): Payer: Medicaid Other | Admitting: Student

## 2023-03-01 VITALS — BP 122/77 | HR 67 | Ht 62.0 in | Wt 192.2 lb

## 2023-03-01 DIAGNOSIS — F4310 Post-traumatic stress disorder, unspecified: Secondary | ICD-10-CM

## 2023-03-01 DIAGNOSIS — F330 Major depressive disorder, recurrent, mild: Secondary | ICD-10-CM | POA: Diagnosis not present

## 2023-03-01 DIAGNOSIS — G4709 Other insomnia: Secondary | ICD-10-CM | POA: Diagnosis not present

## 2023-03-01 DIAGNOSIS — R000 Tachycardia, unspecified: Secondary | ICD-10-CM | POA: Diagnosis not present

## 2023-03-01 DIAGNOSIS — F411 Generalized anxiety disorder: Secondary | ICD-10-CM | POA: Diagnosis not present

## 2023-03-01 MED ORDER — HYDROXYZINE HCL 25 MG PO TABS
25.0000 mg | ORAL_TABLET | Freq: Every evening | ORAL | 3 refills | Status: DC | PRN
Start: 2023-03-01 — End: 2023-05-16

## 2023-03-01 MED ORDER — FLUOXETINE HCL 10 MG PO CAPS: 10.0000 mg | ORAL_CAPSULE | Freq: Every day | ORAL | 1 refills | Status: DC

## 2023-03-01 NOTE — Progress Notes (Signed)
 [facility] MD/PA/NP OP Progress Note  03/01/2023 3:33 PM  Tiffany [name]  MRN:  [id number]  Chief Complaint:  No chief complaint on file.  HPI: Tiffany [name] is a 23 year old patient with a history of mild depression, PTSD, adjustment disorder, ADHD, and dyslexia.  Patient reports she has been compliant with the following medication regimen:    Prozac 20 milligrams daily Hydroxyzine 25mg  TID PRN   Patient presents with mom today and reports that things have been "okay" but she has been unable to sleep until 3-4 AM. She is sleeping until but with frequent urination. Waking after 6-7 hours.   Mood has otherwise been "good." She went from not experiencing emotions at all to a flood of emotions. She has finally begun to grive. She   Mom reports that she snores.   She does take Hydroxyzine 2x per week. Sometimes when relaxed, HR increases to 120s. Went to 121 yesterday. Stays elevated 2-3 minutes. Sternal pain.   Denies SI, HI,        "a rollercoaster." She has been sleeping more than normal, with sleep during the day and difficulty sleeping at night.   More days of irritable mood. Low energy. Appetite is poor; eats one meal and may snack but this is not consistent. She noticed sedation on Zoloft 200 mg. She has not been as depressed, since starting the medication, but feels more tired.  Eating binges. Hx of starving self when younger which became binges when older. Binges include 2 dozen doughnuts. Felt bad but no compensatory behaviors.   Mom reports uncertainty about moods. She has noted that Tiffany [name] has been emotionally numbed.   Requires PRN hydroxyzine only when going out in public.   She sometimes has passive SI (maybe my family would be better off without me), but denies intent to act on them. She writes it out, and it improves. She denies HI and AVH.   Secondhand smoke from both grandparents, mom vapes, denies personal use of tobacco products, alcohol, and illicit substances.     Visit Diagnosis:  No diagnosis found.      Past Psychiatric History: ADHD, adjustment disorder, anxiety, depression, and dyslexia.    Last visit:  10/12/2021-patient Zoloft increased to 150 mg to address PTSD symptoms.   12/2021-patient working on becoming more independent in the grocery store, patient was also getting ready to have an upcoming trip with her boyfriend.  No medication adjustments were made as patient appeared to be benefiting on current dose.   03/2022- Patient's hypervigilance has significantly decreased and she is slowly becoming more independent. Patient also appears to be exhibiting less reliance on mom during assessments.    Update: 04/2022- Patient hospitalized after finding out her boyfriend of 9 years, that lives in [place] has gotten another girl pregnant and she has been feeling very depressed and suicidal with thoughts of overdosing on pills.   05/2022-Patient is using coping skills and engaging well with therapy. Stabilized and doing well. No med adjustments. ignificant decrease in social anxiety.    Past Medical History:  Past Medical History:  Diagnosis Date   ADHD (attention deficit hyperactivity disorder) 01/03/2002   Took Adderall for several years, then Dexedrine, now Strattera   Adjustment disorder with mixed anxiety and depressed mood 04/14/2014   Asthma "since infancy"   triggers more in winter, has had bronchitis/pneumonia   Dyslexia    MDD (major depressive disorder), recurrent episode, severe (HCC) 04/17/2022   Mild depression 07/12/2021   Mood disorder (HCC)  01/04/2011    Past Surgical History:  Procedure Laterality Date   ADENOIDECTOMY     OTHER SURGICAL HISTORY     tubes in the ears   TONSILLECTOMY     TYMPANOSTOMY TUBE PLACEMENT      Family Psychiatric History:  Medical: Reports grandma recently found cancerous polyps in her colon, history of heart problems.  Reports great grandma with seizures.  Reports family history of  dementia Psych:  Mother dyslexia, eating disorder, substance use, anxiety  Brother dyslexia and bipolar, Maternal grandmother dyslexia, depression, anxiety  Psych Rx: Unknown SA/HA: Reports uncle committed suicide Substance use family hx: Reports family history of substance use    Family History:  Family History  Problem Relation Age of Onset   Learning disabilities Mother    Mental illness Mother    Drug abuse Mother    Learning disabilities Brother    Diabetes Maternal Uncle    Heart disease Maternal Grandmother    Hypertension Maternal Grandmother    Learning disabilities Maternal Grandmother    Depression Maternal Grandmother    Anxiety disorder Maternal Grandmother    Learning disabilities Maternal Grandfather    Heart murmur Maternal Grandfather    Alcohol abuse Neg Hx    Clotting disorder Neg Hx    Mental illness Father    Heart disease Father     Social History:  Social History   Socioeconomic History   Marital status: Single    Spouse name: Not on file   Number of children: Not on file   Years of education: Not on file   Highest education level: Not on file  Occupational History   Not on file  Tobacco Use   Smoking status: Never    Passive exposure: Current   Smokeless tobacco: Never   Tobacco comments:    Both grandparents "chain smokers;" mom vapes  Substance and Sexual Activity   Alcohol use: Never   Drug use: Never   Sexual activity: Not Currently  Other Topics Concern   Not on file  Social History Narrative   Not on file   Social Drivers of Health   Financial Resource Strain: Not on file  Food Insecurity: No Food Insecurity (04/17/2022)   Hunger Vital Sign    Worried About Running Out of Food in the Last Year: Never true    Ran Out of Food in the Last Year: Never true  Transportation Needs: No Transportation Needs (04/17/2022)   PRAPARE - Administrator, Civil Service (Medical): No    Lack of Transportation (Non-Medical): No   Physical Activity: Not on file  Stress: Not on file  Social Connections: Not on file    Allergies:  Allergies  Allergen Reactions   Augmentin [Amoxicillin-Pot Clavulanate] Hives and Other (See Comments)        Bee Pollen    Latex    Penicillins Rash    Metabolic Disorder Labs: No results found for: "HGBA1C", "MPG" No results found for: "PROLACTIN" No results found for: "CHOL", "TRIG", "HDL", "CHOLHDL", "VLDL", "LDLCALC" Lab Results  Component Value Date   TSH 3.540 04/19/2022    Therapeutic Level Labs: No results found for: "LITHIUM" No results found for: "VALPROATE" No results found for: "CBMZ"  Current Medications: Current Outpatient Medications  Medication Sig Dispense Refill   albuterol (VENTOLIN HFA) 108 (90 Base) MCG/ACT inhaler Inhale 1-2 puffs into the lungs every 6 (six) hours as needed for wheezing or shortness of breath.     FLUoxetine (PROZAC) 10  MG capsule Take 1-2 capsules (10-20 mg total) by mouth daily. Beginning 1/24, take 10 mg (1 capsule) daily x 14 days, then on 2/7 increase to 20 mg (2 capsules) daily. 42 capsule 1   hydrOXYzine (ATARAX) 25 MG tablet Take 1 tablet (25 mg total) by mouth at bedtime as needed for anxiety. 30 tablet 3   olopatadine (PATANOL) 0.1 % ophthalmic solution Place 1 drop into the right eye 2 (two) times daily. 5 mL 0   sertraline (ZOLOFT) 50 MG tablet Take 1-3 tablets (50-150 mg total) by mouth at bedtime. Beginning 1/24, take 150 mg (3 tablets) nightly x 7 days, then on 1/31 decrease to 100 mg (2 tablets) nightly x 7 days, then on 2/7 decrease to 50 mg nightly (1 tablet) x 7 days, then discontinue medication. 42 tablet 0   No current facility-administered medications for this visit.     Musculoskeletal: Strength & Muscle Tone: within normal limits Gait & Station: normal Patient leans: N/A  Psychiatric Specialty Exam: Review of Systems  Psychiatric/Behavioral:  Negative for dysphoric mood, hallucinations, sleep disturbance  and suicidal ideas. The patient is not nervous/anxious.     There were no vitals taken for this visit.There is no height or weight on file to calculate BMI.  General Appearance: Casual  Eye Contact:  Good  Speech:  Clear and Coherent  Volume:  Normal  Mood:  Euthymic  Affect:  Appropriate  Thought Process:  Coherent  Orientation:  Full (Time, Place, and Person)  Thought Content: Logical   Suicidal Thoughts:  No  Homicidal Thoughts:  No  Memory:  Immediate;   Good Recent;   Good  Judgement:  Good  Insight:  Good  Psychomotor Activity:  Normal  Concentration:  Concentration: Good  Recall:  Good  Fund of Knowledge: Good  Language: Good  Akathisia:  No  Handed:    AIMS (if indicated): not done  Assets:  Communication Skills Desire for Improvement Housing Leisure Time Resilience Social Support  ADL's:  Intact  Cognition: WNL  Sleep:  Good   Screenings: AUDIT    Flowsheet Row [facility]  Alcohol Use Disorder Identification Test Final Score (AUDIT) 0      GAD-7    Flowsheet Row Counselor from 03/29/2022 in  [facility] Counselor from 10/26/2021 in [facility] Counselor from 08/26/2021 in [facility] Counselor from [facility]29/2021 [facility] Video Visit from 07/12/2021 in [facility]  Total GAD-7 Score 6 7 7 6 10       PHQ2-9    Flowsheet Row Counselor from [facility]/27/2023 [facility] from [facility]/16/2022 [facility] Counselor from 03/29/2022 in [facility] Counselor from 10/26/2021 in [facility] Counselor from 08/26/2021 in [facility]  PHQ-2 Total Score 2 2 4 2 4   PHQ-9 Total Score 5 7 12 8 10       Flowsheet Row ED  [facility] 10/29/2022 in [facility] Admission (Discharged) [facility]16[facility]2022 [facility]  C-SSRS RISK CATEGORY No Risk Low Risk No Risk        Assessment and Plan: Patient reports an increase in her anxiety and depressive symptoms since last visit.  Particularly, she is worried about low  mood, fatigue, daytime somnolence.  She notes these symptoms began to worsen with her Zoloft titrated to 200 mg in the morning.  As she has noted some benefit from the medication but with too much somnolence, we will opt to switch medications but within the same SSRI class.  We will switch to Prozac at this time as it is more activating than the Zoloft.  Patient is agreeable with this plan.  She poses no safety concerns at this time, but safety planning is completed with her including reaching out to supports, 988, [facility], or and ED. She is receptive.   MDD  PTSD Social anxiety disorder - We will begin cross titration from Zoloft to Prozac:  -Decreased to Zoloft 150 mg x 1 week, then decrease to 100 mg x 1 week, then decrease to 50 mg x 1 week, then disnhcontinue  -Start Prozac 10 mg x 2 weeks, then increase to 20 mg - Continue hydroxyzine 25 mg nightly  Disordered Eating (h/o avoidant/restrictive eating patterns, current binge eating patterns) -- Will switch SSRI therapy to see if this provides some benefit.  -- Patient currently in therapy with Paige Cozart, LCSW   F/u in 4-6 weeks Collaboration of Care: Collaboration of Care: Dr. Adrian Blackwater  Patient/Guardian was advised Release of Information must be obtained prior to any record release in order to collaborate their care with an outside provider. Patient/Guardian was advised if they have not already done so to contact the registration department to sign all necessary forms in order for [place] to release information regarding their care.   Consent:  Patient/Guardian gives verbal consent for treatment and assignment of benefits for services provided during this visit. Patient/Guardian expressed understanding and agreed to proceed.   PGY-3 Lamar Sprinkles, MD 03/01/2023 3:33 PM

## 2023-03-01 NOTE — Patient Instructions (Signed)
 Your hydroxyzine count was adjusted based on insurance coverage.

## 2023-03-14 ENCOUNTER — Ambulatory Visit (HOSPITAL_COMMUNITY): Payer: Medicaid Other | Admitting: Clinical

## 2023-03-29 ENCOUNTER — Encounter (HOSPITAL_COMMUNITY): Payer: Medicaid Other | Admitting: Student

## 2023-04-03 ENCOUNTER — Ambulatory Visit (INDEPENDENT_AMBULATORY_CARE_PROVIDER_SITE_OTHER): Payer: Medicaid Other | Admitting: Clinical

## 2023-04-03 DIAGNOSIS — F330 Major depressive disorder, recurrent, mild: Secondary | ICD-10-CM | POA: Diagnosis not present

## 2023-04-03 NOTE — Progress Notes (Signed)
   THERAPIST PROGRESS NOTE  Session Time: 45 minutes  Participation Level: Active  Behavioral Response: CasualAlertEuthymic  Type of Therapy: Individual Therapy  Treatment Goals addressed: Charlette WILL IDENTIFY 3 COGNITIVE PATTERNS AND BELIEFS THAT SUPPORT DEPRESSION   ProgressTowards Goals: Progressing  Interventions: CBT  Summary:  Tiffany [name] is a 23 y.o. female who presents for the scheduled appointment oriented times five, appropriately dressed and friendly. Client denied hallucinations and delusions. Client reported she is feeling better today. Client reported she did not come to her last therapy session because she was helping to care for her mother who went to the hospital for her gallbladder. Client reported sitting in the hospital was triggering because her biological dad passed 3 years go and she did not want to think of her mother that way. Client reported her mom is recovered. Client reported otherwise she is on new medication now from the psychiatrist. Client reported she has ben having more emotions visible such as crying. Client reported she is getting use to it. Client reported she also has been somewhat sad. Client reported she has been somewhat upset because her long distant boyfriend has not been as communicative but she is developing better cognitions on how to handle it. Evidence of progress towards goal:  client reported 1 positive of developing awareness of negative thoughts and reframing them.  Suicidal/Homicidal: Nowithout intent/plan  Therapist Response:  Therapist began the appointment asking the client how she has been doing. Therapist engaged using active listening and positive emotional support. Therapist used cbt to engage give the client time to discuss her stressors and how she is handling them. Therapist used cbt to reinforce the client use of positive coping skills. Therapist used CBT ask the client to identify her progress with frequency of use  with coping skills with continued practice in her daily activity.    Therapist assigned the client homework to practice self care.   Plan: Return again in 4 weeks.  Diagnosis: MDD, recurrent, mild  Collaboration of Care: Patient refused AEB none requested by the client.  Patient/Guardian was advised Release of Information must be obtained prior to any record release in order to collaborate their care with an outside provider. Patient/Guardian was advised if they have not already done so to contact the registration department to sign all necessary forms in order for [place] to release information regarding their care.   Consent: Patient/Guardian gives verbal consent for treatment and assignment of benefits for services provided during this visit. Patient/Guardian expressed understanding and agreed to proceed.   [name] Jimesha Rising, LCSW 04/03/2023

## 2023-04-04 ENCOUNTER — Ambulatory Visit (INDEPENDENT_AMBULATORY_CARE_PROVIDER_SITE_OTHER): Payer: Medicaid Other | Admitting: Student

## 2023-04-04 ENCOUNTER — Encounter (HOSPITAL_COMMUNITY): Payer: Self-pay | Admitting: Student

## 2023-04-04 DIAGNOSIS — F330 Major depressive disorder, recurrent, mild: Secondary | ICD-10-CM

## 2023-04-04 DIAGNOSIS — F4310 Post-traumatic stress disorder, unspecified: Secondary | ICD-10-CM

## 2023-04-04 MED ORDER — FLUOXETINE HCL 10 MG PO CAPS
10.0000 mg | ORAL_CAPSULE | Freq: Every day | ORAL | 1 refills | Status: DC
Start: 2023-04-04 — End: 2023-05-16

## 2023-04-04 NOTE — Progress Notes (Signed)
 [facility] MD/PA/NP OP Progress Note  04/04/2023 12:24 PM  Tiffany [name]  MRN:  [id number]  Chief Complaint:  Chief Complaint  Patient presents with   Medication Refill   Follow-up   HPI: Tiffany [name] is a 23 year old patient with a history of mild depression, PTSD, adjustment disorder, ADHD, and dyslexia.  Patient reports she has been compliant with the following medication regimen:    Prozac 10 mg daily Hydroxyzine 25mg  TID PRN and 50 mg at bedtime PRN   Patient presents alone today and reports that things have been "okay," stressed by caring for mom, post-surgery. She has been caring for herself by gardening and drinking a lot of teas, pomegranate raspberry.    Mood has otherwise been "good." She is sleeping better through the night, 6-7 hours. No longer sleeping all throughout the day. Takes 1-2 hour naps. Feels well rested most days. Has sufficient energy. She no longer has "flood of emotions," but has been having problems with boyfriend making poor decisions around this time of month.   Appetite is a bit less. Eating 1-2 meals per day. Sometimes will snack throughout the day.   Sometimes with racing thoughts and becomes "too unbearable" at which she takes a nap. Playing games is no longer distracting.   She does take Hydroxyzine 2x per week, on average, but taking one daily for approx 1 week. 50 mg at bedtime helpful for sleep.   Denies SI, HI, AVH.  Secondhand smoke from both grandparents, mom vapes, denies personal use of tobacco products, alcohol, and illicit substances.    Visit Diagnosis:    ICD-10-CM   1. PTSD (post-traumatic stress disorder)  F43.10 FLUoxetine (PROZAC) 10 MG capsule    2. MDD (major depressive disorder), recurrent episode, mild (HCC)  F33.0 FLUoxetine (PROZAC) 10 MG capsule           Past Psychiatric History: ADHD, adjustment disorder, anxiety, depression, and dyslexia.    Last visit:  10/12/2021-patient Zoloft increased to 150 mg to address PTSD  symptoms.   12/2021-patient working on becoming more independent in the grocery store, patient was also getting ready to have an upcoming trip with her boyfriend.  No medication adjustments were made as patient appeared to be benefiting on current dose.   03/2022- Patient's hypervigilance has significantly decreased and she is slowly becoming more independent. Patient also appears to be exhibiting less reliance on mom during assessments.    Update: 04/2022- Patient hospitalized after finding out her boyfriend of 9 years, that lives in [place] has gotten another girl pregnant and she has been feeling very depressed and suicidal with thoughts of overdosing on pills.   05/2022-Patient is using coping skills and engaging well with therapy. Stabilized and doing well. No med adjustments. ignificant decrease in social anxiety.  01/2023- Patient noted recurrence of depressive sx on high dose Zoloft, so began cross-titration from Zoloft to Prozac. Mom also noted that before resurgence of depressive sx, there was emotional dulling.  02/2023- Patient noted to feel more "weepy" with cross-taper and titration of Prozac to 20 mg. Decreased Prozac to 10 mg.    Past Medical History:  Past Medical History:  Diagnosis Date   ADHD (attention deficit hyperactivity disorder) 01/03/2002   Took Adderall for several years, then Dexedrine, now Strattera   Adjustment disorder with mixed anxiety and depressed mood 04/14/2014   Asthma "since infancy"   triggers more in winter, has had bronchitis/pneumonia   Dyslexia    MDD (major depressive disorder), recurrent episode,  severe (HCC) 04/17/2022   Mild depression 07/12/2021   Mood disorder (HCC) 01/04/2011    Past Surgical History:  Procedure Laterality Date   ADENOIDECTOMY     OTHER SURGICAL HISTORY     tubes in the ears   TONSILLECTOMY     TYMPANOSTOMY TUBE PLACEMENT      Family Psychiatric History:  Medical: Reports grandma recently found cancerous polyps in  her colon, history of heart problems.  Reports great grandma with seizures.  Reports family history of dementia Psych:  Mother dyslexia, eating disorder, substance use, anxiety  Brother dyslexia and bipolar, Maternal grandmother dyslexia, depression, anxiety  Psych Rx: Unknown SA/HA: Reports uncle committed suicide Substance use family hx: Reports family history of substance use    Family History:  Family History  Problem Relation Age of Onset   Learning disabilities Mother    Mental illness Mother    Drug abuse Mother    Learning disabilities Brother    Diabetes Maternal Uncle    Heart disease Maternal Grandmother    Hypertension Maternal Grandmother    Learning disabilities Maternal Grandmother    Depression Maternal Grandmother    Anxiety disorder Maternal Grandmother    Learning disabilities Maternal Grandfather    Heart murmur Maternal Grandfather    Alcohol abuse Neg Hx    Clotting disorder Neg Hx    Mental illness Father    Heart disease Father     Social History:  Social History   Socioeconomic History   Marital status: Single    Spouse name: Not on file   Number of children: Not on file   Years of education: Not on file   Highest education level: Not on file  Occupational History   Not on file  Tobacco Use   Smoking status: Never    Passive exposure: Current   Smokeless tobacco: Never   Tobacco comments:    Both grandparents "chain smokers;" mom vapes  Substance and Sexual Activity   Alcohol use: Never   Drug use: Never   Sexual activity: Not Currently  Other Topics Concern   Not on file  Social History Narrative   Not on file   Social Drivers of Health   Financial Resource Strain: Not on file  Food Insecurity: No Food Insecurity (04/17/2022)   Hunger Vital Sign    Worried About Running Out of Food in the Last Year: Never true    Ran Out of Food in the Last Year: Never true  Transportation Needs: No Transportation Needs (04/17/2022)   PRAPARE -  Administrator, Civil Service (Medical): No    Lack of Transportation (Non-Medical): No  Physical Activity: Not on file  Stress: Not on file  Social Connections: Not on file    Allergies:  Allergies  Allergen Reactions   Augmentin [Amoxicillin-Pot Clavulanate] Hives and Other (See Comments)        Bee Pollen    Latex    Penicillins Rash    Metabolic Disorder Labs: No results found for: "HGBA1C", "MPG" No results found for: "PROLACTIN" No results found for: "CHOL", "TRIG", "HDL", "CHOLHDL", "VLDL", "LDLCALC" Lab Results  Component Value Date   TSH 3.540 04/19/2022    Therapeutic Level Labs: No results found for: "LITHIUM" No results found for: "VALPROATE" No results found for: "CBMZ"  Current Medications: Current Outpatient Medications  Medication Sig Dispense Refill   albuterol (VENTOLIN HFA) 108 (90 Base) MCG/ACT inhaler Inhale 1-2 puffs into the lungs every 6 (six) hours as needed  for wheezing or shortness of breath.     hydrOXYzine (ATARAX) 25 MG tablet Take 1-2 tablets (25-50 mg total) by mouth at bedtime as needed for anxiety. May take 25 mg three times daily as needed for anxiety and 50 mg at bedtime as needed for sleep. 68 each 3   olopatadine (PATANOL) 0.1 % ophthalmic solution Place 1 drop into the right eye 2 (two) times daily. 5 mL 0   FLUoxetine (PROZAC) 10 MG capsule Take 1 capsule (10 mg total) by mouth daily. 30 capsule 1   No current facility-administered medications for this visit.     Musculoskeletal: Strength & Muscle Tone: within normal limits Gait & Station: normal Patient leans: N/A  Psychiatric Specialty Exam: Review of Systems  Psychiatric/Behavioral:  Negative for dysphoric mood, hallucinations, sleep disturbance and suicidal ideas. The patient is not nervous/anxious.     Blood pressure 111/70, pulse 71, height 5\' 2"  (1.575 m), weight 193 lb 9.6 oz (87.8 kg), SpO2 100%.Body mass index is 35.41 kg/m.  General Appearance: Casual   Eye Contact:  Good  Speech:  Clear and Coherent  Volume:  Normal  Mood:  Euthymic  Affect:  Appropriate  Thought Process:  Coherent  Orientation:  Full (Time, Place, and Person)  Thought Content: Logical   Suicidal Thoughts:  No  Homicidal Thoughts:  No  Memory:  Immediate;   Good Recent;   Good  Judgement:  Good  Insight:  Good  Psychomotor Activity:  Normal  Concentration:  Concentration: Good  Recall:  Good  Fund of Knowledge: Good  Language: Good  Akathisia:  No  Handed:    AIMS (if indicated): not done  Assets:  Communication Skills Desire for Improvement Housing Leisure Time Resilience Social Support  ADL's:  Intact  Cognition: WNL  Sleep:  Fair   Screenings: AUDIT    Flowsheet Row [facility]  Alcohol Use Disorder Identification Test Final Score (AUDIT) 0      GAD-7    Flowsheet Row Counselor from 03/29/2022 in [facility] Counselor from 10/26/2021 in [facility] Counselor from 08/26/2021 in [facility] Counselor from 07/29/2021 in [facility] Video Visit from 07[facility]12/2021 in [facility]  Total GAD-7 Score 6 7 7 6 10       PHQ2-9    Flowsheet Row Counselor from 02/27/2023 in [facility] ED from 04/16/2022 in [facility] Counselor from 03/29/2022 in [facility] Counselor from 10/26/2021 in [facility] Counselor from 08/26/2021 in [facility]  PHQ-2 Total Score 2 2 4 2 4   PHQ-9 Total Score 5 7 12 8 10       Flowsheet Row ED from 10/29/2022 in [facility] Admission (Discharged) [facility] from [facility]16/2022 [facility]  C-SSRS RISK CATEGORY No Risk Low Risk No Risk        Assessment and Plan: Patient reports sustained improvement in her mood, emotional dulling, and lethargy since switching from Zoloft to Prozac.  She has been tolerating the decrease in Prozac much better. She is sleeping well with increased dosage of Hydroxyzine, her appetite is intact,  She does have a history of disordered eating, so she intentionally forces herself to eat something for breakfast.  She  denies binges since the last visit.  She also denies the desire to restrict, and denies compensatory behaviors for eating.  She poses no safety concerns at this time, but safety planning is completed with her including reaching out to supports, 988, [facility], or and ED. She is receptive.   Will continue medications as prescribed.   MDD  PTSD Social anxiety disorder - Continue Prozac 10 mg daily for depressive and anxiety symptoms - Continue hydroxyzine 25 mg 3 times daily as needed for anxiety, and hydroxyzine 50 mg nightly as needed for sleep  Disordered Eating (h/o avoidant/restrictive eating patterns, current binge eating patterns), controlled -- Will continue Prozac -- Patient currently in therapy with Paige Cozart, LCSW  #Tachycardia of unknown etiology -Referral placed for primary care   F/u in approx 6 weeks Collaboration of Care: Collaboration of Care: Dr. Josephina Shih  Patient/Guardian was advised Release of Information must be obtained prior to any record release in order to collaborate their care with an outside provider. Patient/Guardian was advised if they have not already done so to contact the registration department to sign all necessary forms in order for [place] to release information regarding their care.   Consent: Patient/Guardian gives verbal consent for treatment and assignment of benefits for services provided during this visit. Patient/Guardian expressed understanding and agreed to proceed.    PGY-3 Lamar Sprinkles, MD 04/04/2023 12:24 PM

## 2023-05-04 ENCOUNTER — Telehealth: Payer: Self-pay

## 2023-05-04 DIAGNOSIS — Z0000 Encounter for general adult medical examination without abnormal findings: Secondary | ICD-10-CM

## 2023-05-09 ENCOUNTER — Telehealth: Payer: Self-pay

## 2023-05-09 NOTE — Progress Notes (Signed)
 Thank you for this referral. The [facility] team receives referrals for eligible patients: all patients with [facility] Primary Care Provider (any health plan including Medicaid or uninsured) and patients with a [facility] Primary Care Provider AND contracted health plan.     This patient does not have a [facility] or [facility] Primary Care Provider. [facility] cannot directly provide Care Management services to patients who do not have a qualifying Primary Care Provider. Please call us if you need further clarification at [phone].     [facility]  [facility], [facility] Health Care Management Assistant Direct Dial: [phone]  Fax: [phone]

## 2023-05-16 ENCOUNTER — Encounter (HOSPITAL_COMMUNITY): Payer: Self-pay | Admitting: Student

## 2023-05-16 ENCOUNTER — Ambulatory Visit (INDEPENDENT_AMBULATORY_CARE_PROVIDER_SITE_OTHER): Admitting: Student

## 2023-05-16 VITALS — BP 125/69 | HR 78 | Ht 62.0 in | Wt 194.2 lb

## 2023-05-16 DIAGNOSIS — F330 Major depressive disorder, recurrent, mild: Secondary | ICD-10-CM

## 2023-05-16 DIAGNOSIS — F411 Generalized anxiety disorder: Secondary | ICD-10-CM

## 2023-05-16 DIAGNOSIS — F4310 Post-traumatic stress disorder, unspecified: Secondary | ICD-10-CM | POA: Diagnosis not present

## 2023-05-16 MED ORDER — PRAZOSIN HCL 1 MG PO CAPS: 1.0000 mg | ORAL_CAPSULE | Freq: Every day | ORAL | 1 refills | Status: DC

## 2023-05-16 MED ORDER — FLUOXETINE HCL 10 MG PO CAPS
10.0000 mg | ORAL_CAPSULE | Freq: Every day | ORAL | 1 refills | Status: DC
Start: 2023-05-16 — End: 2023-06-15

## 2023-05-16 MED ORDER — HYDROXYZINE HCL 25 MG PO TABS
25.0000 mg | ORAL_TABLET | Freq: Every evening | ORAL | 3 refills | Status: DC | PRN
Start: 2023-05-16 — End: 2023-07-25
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
## 2023-05-17 ENCOUNTER — Encounter (HOSPITAL_COMMUNITY): Payer: Self-pay | Admitting: Student

## 2023-05-17 DIAGNOSIS — F330 Major depressive disorder, recurrent, mild: Secondary | ICD-10-CM | POA: Insufficient documentation

## 2023-05-17 NOTE — Addendum Note (Signed)
 Addended by: Donnelly Gainer on: 05/17/2023 02:06 PM   Modules accepted: Level of Service

## 2023-05-22 ENCOUNTER — Ambulatory Visit (INDEPENDENT_AMBULATORY_CARE_PROVIDER_SITE_OTHER): Admitting: Clinical

## 2023-05-22 DIAGNOSIS — F330 Major depressive disorder, recurrent, mild: Secondary | ICD-10-CM | POA: Diagnosis not present

## 2023-05-22 NOTE — Progress Notes (Signed)
 THERAPIST PROGRESS NOTE  Session Time: 40 minutes  Participation Level: Active  Behavioral Response: CasualAlertEuthymic  Type of Therapy: Individual Therapy  Treatment Goals addressed: Milea WILL IDENTIFY 3 COGNITIVE PATTERNS AND BELIEFS THAT SUPPORT DEPRESSION   ProgressTowards Goals: Progressing  Interventions: CBT and Supportive  Summary:  Tiffany [name] is a 23 y.o. female who presents for the scheduled appointment oriented x 5, appropriately dressed, and friendly.  Client denied hallucinations and delusions. Client reported on today things have been fairly okay but some challenges. Client reported on a positive note she has made a few new friends that she has exchange numbers with. Client reported they also plays online games together and have other common interests such as reading books and TV shows.  Client reported she sees that they do a lot of things together such as traveling and going out to places within the cities together. Client reported he has once again provoked her thoughts of wanting to venture out of being able to have a part-time job and/or feeling comfortable just being out in public with friends. Client reported at this current time the only way that she feels comfortable going out or in stories is when she is with family. Client stated "but that can't be forever". Client reported she has anxiety and PTSD but she does not want to overwhelm her friends and tell them all of her triggers and issues to make them uncomfortable. Client reported she doesn't want to look "crazy" if she has to get up and leave abruptly. Client reported she was also said because her boyfriend had not been communicating with her as much. Client reported she understands he is busy with work and school. Client reported she wont be able to see him for their anniversary this month which makes her sad. Client reported when they are apart it causes her to overthink about who he may be talking to for  example. Client reported her mom recently had surgery and she had to step up to help her mom with that. Client reported feeling proud of herself for being confident in doing that when she has a hard time doing it for herself. Client the month of may also is the anniversary of her dads passing and beloved previous pets. Client reported overthinking about the future can cause her lingering stress. Evidence of progress towards goal:  client reported 1 cognitive pattern of over thinking which causes depression and passive suicidal ideations.   Suicidal/Homicidal: Nowithout intent/plan  Therapist Response:  Therapist began the appointment asking the client how she has been doing since last seen. Therapist engaged with active listening and positive emotional support. Therapist used cbt to engage and ask the client about current severity of depressive symptoms. Therapist had the client identify how she attempts to intervene when passive S.I is present and discussed safety planning with her. Therapist used cbt to teach the client about how to "urge surf" fleeting emotions and thoughts. Therapist used CBT ask the client to identify her progress with frequency of use with coping skills with continued practice in her daily activity.    Therapist assigned the client homework to read the worksheets given to her.   Plan: Return again in 4 weeks.  Diagnosis: mdd, recurrent, mild  Collaboration of Care: Patient refused AEB none requested by the client.  Patient/Guardian was advised Release of Information must be obtained prior to any record release in order to collaborate their care with an outside provider. Patient/Guardian was advised if they have  not already done so to contact the registration department to sign all necessary forms in order for us  to release information regarding their care.   Consent: Patient/Guardian gives verbal consent for treatment and assignment of benefits for services provided  during this visit. Patient/Guardian expressed understanding and agreed to proceed.   Tuff Clabo Y Khoa Opdahl, LCSW 05/22/2023

## 2023-06-13 ENCOUNTER — Ambulatory Visit (INDEPENDENT_AMBULATORY_CARE_PROVIDER_SITE_OTHER): Admitting: Clinical

## 2023-06-13 DIAGNOSIS — F330 Major depressive disorder, recurrent, mild: Secondary | ICD-10-CM | POA: Diagnosis not present

## 2023-06-13 NOTE — Progress Notes (Signed)
   THERAPIST PROGRESS NOTE  Session Time: 45 minutes  Participation Level: Active  Behavioral Response: CasualAlertAnxious  Type of Therapy: Individual Therapy  Treatment Goals addressed: Santosha WILL IDENTIFY 3 COGNITIVE PATTERNS AND BELIEFS THAT SUPPORT DEPRESSION   ProgressTowards Goals: Progressing  Interventions: CBT and Supportive  Summary:  Tiffany [name] is a 23 y.o. female who presents for the scheduled appointment oriented times five, appropriately dressed and friendly. Client denied hallucinations and delusions. Client reported on today a lot has been going on.  Client reported the month of June is difficult because a lot of last has happened including her biological father and stepdad.  Client reported her brother usually does not bring up grief due to his difficulty with expressing himself related to his own mental issues.  Client reported lately he has been saying how sad he is related to grief. Client reported she has been trying to stay strong and help him with how he can work through his sadness. Client reported she has the background cognitions of thinking of her actions as a cause and effect. Client reported when she does something bad she feels like they are watching her with disappointment and the opposite when she does good. Client reported also fighting those negative thoughts and emotions related to her own self worth. Client reported she has struggled with keeping a positive view of herself. Client reported she needs to work on managing her stress and not taking on others issues as her own. Evidence of progress towards goal:  client reported 1 cognitive pattern of learning to cope with grief that continues to affect depression.  Suicidal/Homicidal: Nowithout intent/plan  Therapist Response:  Therapist began the appointment asking the client how she has been doing. Therapist engaged with active listening and positive emotional support. Therapist used cbt to have her  identify triggering thoughts/ memories that have contributed to her low mood. Therapist used cbt to engage in discussion about coping with grief ongoing, resilience and confidence. Therapist used cbt to prompt the client to identify her personal strengths. Therapist used CBT ask the client to identify her progress with frequency of use with coping skills with continued practice in her daily activity.    Therapist assigned the client homework to practice self care.   Plan: Return again in 4 weeks.  Diagnosis: mdd, recurrent episode, mild  Collaboration of Care: Patient refused AEB none requested by the client.  Patient/Guardian was advised Release of Information must be obtained prior to any record release in order to collaborate their care with an outside provider. Patient/Guardian was advised if they have not already done so to contact the registration department to sign all necessary forms in order for us  to release information regarding their care.   Consent: Patient/Guardian gives verbal consent for treatment and assignment of benefits for services provided during this visit. Patient/Guardian expressed understanding and agreed to proceed.   Innocence Schlotzhauer Y Sheffield Hawker, LCSW 06/13/2023

## 2023-06-15 ENCOUNTER — Encounter (HOSPITAL_COMMUNITY): Payer: Self-pay | Admitting: Student

## 2023-06-15 ENCOUNTER — Ambulatory Visit (HOSPITAL_COMMUNITY): Admitting: Student

## 2023-06-15 VITALS — BP 115/76 | HR 75

## 2023-06-15 DIAGNOSIS — F3341 Major depressive disorder, recurrent, in partial remission: Secondary | ICD-10-CM

## 2023-06-15 DIAGNOSIS — F411 Generalized anxiety disorder: Secondary | ICD-10-CM | POA: Diagnosis not present

## 2023-06-15 DIAGNOSIS — F4310 Post-traumatic stress disorder, unspecified: Secondary | ICD-10-CM | POA: Diagnosis not present

## 2023-06-15 MED ORDER — PROPRANOLOL HCL 10 MG PO TABS
10.0000 mg | ORAL_TABLET | Freq: Three times a day (TID) | ORAL | 1 refills | Status: DC | PRN
Start: 2023-06-15 — End: 2023-07-25

## 2023-06-15 MED ORDER — PRAZOSIN HCL 1 MG PO CAPS
1.0000 mg | ORAL_CAPSULE | Freq: Every day | ORAL | 1 refills | Status: DC
Start: 2023-06-15 — End: 2023-07-25

## 2023-06-15 MED ORDER — VENLAFAXINE HCL ER 37.5 MG PO CP24
37.5000 mg | ORAL_CAPSULE | Freq: Every day | ORAL | 0 refills | Status: DC
Start: 2023-06-15 — End: 2023-07-25

## 2023-06-15 NOTE — Progress Notes (Signed)
 [facility] MD/PA/NP OP Progress Note  06/15/2023 11:46 AM  Tiffany [name]  MRN:  [id number]  Chief Complaint:  Medication Management  HPI: Tiffany [name] is a 23 year old patient with a history of mild depression, PTSD, adjustment disorder, ADHD, and dyslexia.  Patient reports she has been compliant with the following medication regimen:    Prozac  10 mg daily Hydroxyzine  25mg  TID PRN and 50 mg at bedtime PRN (taking 50 mg in the afternoon and 25 mg at bedtime)  Patient presents today with mom.  She reports that nothing has dramatically changed since last visit.  She does report ongoing anxiety though as she requires taking hydroxyzine  twice a day.  She reports hydroxyzine  is mildly effective in helping with her anxiety but does not last very long.  She does report significant sedation following hydroxyzine .  She reports sleep remains difficult in terms of initiation given that she often ruminates and overthinks.  She notices that she will need hydroxyzine  when she goes outside as being around people causes significant stress for her.  She endorses symptoms of chest palpitations, diaphoresis, and tremors. She is open to trialing different antidepressant and PRN if it would better help her ongoing symptoms.  She reports that the prazosin  has been helpful in terms addressing nightmares.  She reports still sporadically having them but will not  Denies active SI, HI, AVH.  Secondhand smoke from both grandparents, mom vapes, denies personal use of tobacco products, alcohol, and illicit substances.    Visit Diagnosis:  No diagnosis found.         Past Psychiatric History: ADHD, adjustment disorder, anxiety, depression, and dyslexia.    Last visit:  10/12/2021-patient Zoloft  increased to 150 mg to address PTSD symptoms.   12/2021-patient working on becoming more independent in the grocery store, patient was also getting ready to have an upcoming trip with her boyfriend.  No medication adjustments  were made as patient appeared to be benefiting on current dose.   03/2022- Patient's hypervigilance has significantly decreased and she is slowly becoming more independent. Patient also appears to be exhibiting less reliance on mom during assessments.    Update: 04/2022- Patient hospitalized after finding out her boyfriend of 9 years, that lives in [place]  has gotten another girl pregnant and she has been feeling very depressed and suicidal with thoughts of overdosing on pills.   05/2022-Patient is using coping skills and engaging well with therapy. Stabilized and doing well. No med adjustments. ignificant decrease in social anxiety.  01/2023- Patient noted recurrence of depressive sx on high dose Zoloft , so began cross-titration from Zoloft  to Prozac . Mom also noted that before resurgence of depressive sx, there was emotional dulling.  02/2023- Patient noted to feel more weepy with cross-taper and titration of Prozac  to 20 mg. Decreased Prozac  to 10 mg.    Past Medical History:  Past Medical History:  Diagnosis Date   ADHD (attention deficit hyperactivity disorder) 01/03/2002   Took Adderall for several years, then Dexedrine, now Strattera   Adjustment disorder with mixed anxiety and depressed mood 04/14/2014   Asthma since infancy   triggers more in winter, has had bronchitis/pneumonia   Dyslexia    MDD (major depressive disorder), recurrent episode, severe (HCC) 04/17/2022   MDD (major depressive disorder), recurrent, in partial remission (HCC) 07/01/2022   Mild depression 07/12/2021   Mood disorder (HCC) 01/04/2011    Past Surgical History:  Procedure Laterality Date   ADENOIDECTOMY     OTHER SURGICAL HISTORY  tubes in the ears   TONSILLECTOMY     TYMPANOSTOMY TUBE PLACEMENT      Family Psychiatric History:  Medical: Reports grandma recently found cancerous polyps in her colon, history of heart problems.  Reports great grandma with seizures.  Reports family history of  dementia Psych:  Mother dyslexia, eating disorder, substance use, anxiety  Brother dyslexia and bipolar, Maternal grandmother dyslexia, depression, anxiety  Psych Rx: Unknown SA/HA: Reports uncle committed suicide Substance use family hx: Reports family history of substance use    Family History:  Family History  Problem Relation Age of Onset   Learning disabilities Mother    Mental illness Mother    Drug abuse Mother    Learning disabilities Brother    Diabetes Maternal Uncle    Heart disease Maternal Grandmother    Hypertension Maternal Grandmother    Learning disabilities Maternal Grandmother    Depression Maternal Grandmother    Anxiety disorder Maternal Grandmother    Learning disabilities Maternal Grandfather    Heart murmur Maternal Grandfather    Alcohol abuse Neg Hx    Clotting disorder Neg Hx    Mental illness Father    Heart disease Father     Social History:  Social History   Socioeconomic History   Marital status: Single    Spouse name: Not on file   Number of children: Not on file   Years of education: Not on file   Highest education level: Not on file  Occupational History   Not on file  Tobacco Use   Smoking status: Never    Passive exposure: Current   Smokeless tobacco: Never   Tobacco comments:    Both grandparents chain smokers; mom vapes  Substance and Sexual Activity   Alcohol use: Never   Drug use: Never   Sexual activity: Not Currently  Other Topics Concern   Not on file  Social History Narrative   Not on file   Social Drivers of Health   Financial Resource Strain: Not on file  Food Insecurity: No Food Insecurity (04/17/2022)   Hunger Vital Sign    Worried About Running Out of Food in the Last Year: Never true    Ran Out of Food in the Last Year: Never true  Transportation Needs: No Transportation Needs (04/17/2022)   PRAPARE - Administrator, Civil Service (Medical): No    Lack of Transportation (Non-Medical): No   Physical Activity: Not on file  Stress: Not on file  Social Connections: Not on file    Allergies:  Allergies  Allergen Reactions   Augmentin [Amoxicillin-Pot Clavulanate] Hives and Other (See Comments)        Bee Pollen    Latex    Penicillins Rash    Metabolic Disorder Labs: No results found for: HGBA1C, MPG No results found for: PROLACTIN No results found for: CHOL, TRIG, HDL, CHOLHDL, VLDL, LDLCALC Lab Results  Component Value Date   TSH 3.540 04/19/2022    Therapeutic Level Labs: No results found for: LITHIUM No results found for: VALPROATE No results found for: CBMZ  Current Medications: Current Outpatient Medications  Medication Sig Dispense Refill   albuterol  (VENTOLIN  HFA) 108 (90 Base) MCG/ACT inhaler Inhale 1-2 puffs into the lungs every 6 (six) hours as needed for wheezing or shortness of breath.     FLUoxetine  (PROZAC ) 10 MG capsule Take 1 capsule (10 mg total) by mouth daily. 30 capsule 1   hydrOXYzine  (ATARAX ) 25 MG tablet Take 1-2 tablets (25-50  mg total) by mouth at bedtime as needed for anxiety. May take 25 mg three times daily as needed for anxiety and 50 mg at bedtime as needed for sleep. 68 tablet 3   olopatadine  (PATANOL) 0.1 % ophthalmic solution Place 1 drop into the right eye 2 (two) times daily. 5 mL 0   prazosin  (MINIPRESS ) 1 MG capsule Take 1 capsule (1 mg total) by mouth at bedtime. 30 capsule 1   No current facility-administered medications for this visit.     Musculoskeletal: Strength & Muscle Tone: within normal limits Gait & Station: normal Patient leans: N/A  Psychiatric Specialty Exam: Review of Systems  Psychiatric/Behavioral:  Negative for dysphoric mood, hallucinations, sleep disturbance and suicidal ideas. The patient is not nervous/anxious.     There were no vitals taken for this visit.There is no height or weight on file to calculate BMI.  General Appearance: Casual  Eye Contact:  Good  Speech:   Clear and Coherent  Volume:  Normal  Mood:  Euthymic  Affect:  Appropriate  Thought Process:  Coherent  Orientation:  Full (Time, Place, and Person)  Thought Content: Logical   Suicidal Thoughts:  No  Homicidal Thoughts:  No  Memory:  Immediate;   Good Recent;   Good  Judgement:  Good  Insight:  Good  Psychomotor Activity:  Normal  Concentration:  Concentration: Good  Recall:  Good  Fund of Knowledge: Good  Language: Good  Akathisia:  No  Handed:    AIMS (if indicated): not done  Assets:  Communication Skills Desire for Improvement Housing Leisure Time Resilience Social Support  ADL's:  Intact  Cognition: WNL  Sleep:  Fair   Screenings: AUDIT    Flowsheet Row [facility]  Alcohol Use Disorder Identification Test Final Score (AUDIT) 0   GAD-7    Flowsheet Row Counselor from 03/29/2022 in [facility] Counselor from 10/26/2021 in [facility] Counselor from 08/26/2021 in [facility] Counselor from 07/29/2021 in [facility] Video Visit from 07/12/2021 in [facility]  Total GAD-7 Score 6 7 7 6 10    PHQ2-9    Flowsheet Row Counselor from 02/27/2023 in [facility] ED from 04/16/2022 in [facility] Counselor from 03/29/2022 in [facility] Counselor from 10/26/2021 in [facility] Counselor from 08/26/2021 in [facility]  PHQ-2 Total Score 2 2 4 2 4   PHQ-9 Total Score 5 7 12 8 10    Flowsheet Row UC from 10/29/2022 in [facility] Ad[facility] (Discharged[facility] from 04/16/2022 in [facility]  C-SSRS RISK CATEGORY No  Risk Low Risk No Risk     Assessment and Plan: Patient reports overall stability with ongoing anxiety but still poorly controlled and unable to tolerate higher doses of fluoxetine .  Advised that they do a washout of fluoxetine  and transition over to Effexor as mom has felt it was helpful for her own anxiety. Patient will do a 7 day washout followed by starting effexor at 37.5 mg.  She continues to use hydroxyzine  twice daily which appears to mildly help with anxiety but primarily sedates her.  She was amenable to switching from hydroxyzine  and trialing propranolol for social anxiety.  She does have a history of  asthma but provided return precautions and advised her to reach out if she were to have any worsening asthma while trialing propranolol. Given ongoing success with prazosin  we will continue this as prescribed.   MDD  PTSD Social anxiety disorder - STOP Prozac  -- start propranolol 10 mg tid prn for social anxiety -- Continue prazosin  1 mg nightly for trauma related anxiety and nightmares. - stop hydroxyzine  -- START effexor XR 37.5 mg daily  Disordered Eating (h/o avoidant/restrictive eating patterns, current binge eating patterns), controlled -- effexor as above -- Patient currently in therapy with Paige Cozart, LCSW  #Tachycardia of unknown etiology -Referral placed for primary care   F/u in approx 4-6 weeks Collaboration of Care: Collaboration of Care: MD attending  Patient/Guardian was advised Release of Information must be obtained prior to any record release in order to collaborate their care with an outside provider. Patient/Guardian was advised if they have not already done so to contact the registration department to sign all necessary forms in order for us  to release information regarding their care.   Consent: Patient/Guardian gives verbal consent for treatment and assignment of benefits for services provided during this visit. Patient/Guardian expressed understanding and  agreed to proceed.   PGY-3 Augusta Blizzard, MD 06/15/2023 11:46 AM

## 2023-06-15 NOTE — Patient Instructions (Signed)
 Hi Niko,  It was a pleasure meeting with you today. Please take the following:  --Stop taking fluoxetine  --Please start Effexor 37.5 mg in about 1 week --please take propranolol 10 mg three times a day as needed for social anxiety --continue prazosin  1 mg every night for nightmares  Take care, Dr. Leia Pun

## 2023-06-28 ENCOUNTER — Ambulatory Visit (INDEPENDENT_AMBULATORY_CARE_PROVIDER_SITE_OTHER): Admitting: Clinical

## 2023-06-28 DIAGNOSIS — F3341 Major depressive disorder, recurrent, in partial remission: Secondary | ICD-10-CM | POA: Diagnosis not present
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
## 2023-07-13 ENCOUNTER — Encounter (HOSPITAL_COMMUNITY): Payer: Self-pay

## 2023-07-13 ENCOUNTER — Ambulatory Visit (HOSPITAL_COMMUNITY)
Admission: EM | Admit: 2023-07-13 | Discharge: 2023-07-13 | Disposition: A | Discharge status: Home / Self Care | Attending: Internal Medicine | Admitting: Internal Medicine

## 2023-07-13 DIAGNOSIS — H9202 Otalgia, left ear: Secondary | ICD-10-CM

## 2023-07-13 DIAGNOSIS — H66002 Acute suppurative otitis media without spontaneous rupture of ear drum, left ear: Secondary | ICD-10-CM | POA: Diagnosis not present

## 2023-07-13 DIAGNOSIS — R42 Dizziness and giddiness: Secondary | ICD-10-CM | POA: Diagnosis not present

## 2023-07-13 DIAGNOSIS — H6992 Unspecified Eustachian tube disorder, left ear: Secondary | ICD-10-CM

## 2023-07-13 MED ORDER — PREDNISONE 20 MG PO TABS: 40.0000 mg | ORAL_TABLET | Freq: Every day | ORAL | 0 refills | Status: AC

## 2023-07-13 MED ORDER — AZITHROMYCIN 250 MG PO TABS: ORAL_TABLET | ORAL | 0 refills | Status: DC

## 2023-07-13 NOTE — ED Provider Notes (Signed)
 [facility]    CSN: [id number] Arrival date & time: 07/13/23  1617      History   Chief Complaint Chief Complaint  Patient presents with   Otalgia    HPI Tiffany [name] is a 23 y.o. female.   23 y.o. female who presents to urgent care with complaints of left ear pain that is radiating down into the left throat and left neck along with some mild dizziness.  She reports this started about 2 days ago.  The symptoms have gotten somewhat worse.  She does have a history of recurrent ear infections especially as a baby requiring several tube placements, tonsillectomy and adenoidectomy.  She has not had any fevers, chills, abdominal pain, nausea or vomiting but has felt like she was running a fever.  She took ibuprofen  earlier today.  Her mom reports that they put some Vicks vapor rub into her ears which normally helps if she has ear pain.  She denies any loss of hearing.   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   Mild depression 07/12/2021   Mood disorder (HCC) 01/04/2011    Patient Active Problem List   Diagnosis Date Noted   MDD (major depressive disorder), recurrent episode, mild (HCC) 05/17/2023   GAD (generalized anxiety disorder) 04/18/2022   PTSD (post-traumatic stress disorder) 04/18/2022   Bilateral hip pain 04/20/2015   History of self-harm 04/14/2014   Dry eyes 01/08/2014   Dyslexia  12/06/2013   Dysmenorrhea in adolescent 11/12/2013   Surveillance of previously prescribed contraceptive method 11/12/2013   History of ADHD 09/04/2013   Asthma, chronic 09/04/2013    Past Surgical History:  Procedure Laterality Date   ADENOIDECTOMY     OTHER SURGICAL HISTORY     tubes in the ears   TONSILLECTOMY     TYMPANOSTOMY TUBE PLACEMENT      OB History   No obstetric history on file.      Home Medications    Prior to Admission medications   Medication Sig Start Date End Date Taking? Authorizing Provider  azithromycin  (ZITHROMAX ) 250 MG tablet Take first 2 tablets together, then 1 every day until finished. 07/13/23  Yes Gurdeep Keesey A, PA-C  predniSONE  (DELTASONE ) 20 MG tablet Take 2 tablets (40 mg total) by mouth daily with breakfast for 5 days. 07/13/23 07/18/23 Yes Garron Eline A, PA-C  albuterol  (VENTOLIN  HFA) 108 (90 Base) MCG/ACT inhaler Inhale 1-2 puffs into the lungs every 6 (six) hours as needed for wheezing or shortness of breath. 04/21/22   Massengill, Rankin, MD  hydrOXYzine  (ATARAX ) 25 MG tablet Take 1-2 tablets (25-50 mg total) by mouth at bedtime as needed for anxiety. May take 25 mg three times daily as needed for anxiety and 50 mg at bedtime as needed for sleep. 05/16/23   Rainelle Pfeiffer, MD  olopatadine  (PATANOL) 0.1 % ophthalmic solution Place 1 drop into the right eye 2 (two) times daily. 10/29/22  Anders Otto DASEN, MD  prazosin  (MINIPRESS ) 1 MG capsule Take 1 capsule (1 mg total) by mouth at bedtime. 06/15/23 08/14/23  Lynnette Barter, MD  propranolol  (INDERAL ) 10 MG tablet Take 1 tablet (10 mg total) by mouth 3 (three) times daily as needed (anxiety). 06/15/23   Lynnette Barter, MD  venlafaxine  XR (EFFEXOR -XR) 37.5 MG 24 hr capsule Take 1 capsule (37.5 mg total) by mouth daily with breakfast. Start taking 06/22/23 06/15/23   Lynnette Barter, MD    Family History Family History  Problem Relation Age of Onset   Learning disabilities Mother    Mental illness Mother     Drug abuse Mother    Mental illness Father    Heart disease Father    Learning disabilities Brother    Heart disease Maternal Grandmother    Hypertension Maternal Grandmother    Learning disabilities Maternal Grandmother    Depression Maternal Grandmother    Anxiety disorder Maternal Grandmother    Learning disabilities Maternal Grandfather    Heart murmur Maternal Grandfather    Diabetes Maternal Uncle    Alcohol abuse Neg Hx    Clotting disorder Neg Hx     Social History Social History   Tobacco Use   Smoking status: Never    Passive exposure: Current   Smokeless tobacco: Never   Tobacco comments:    Both grandparents chain smokers; mom vapes  Vaping Use   Vaping status: Never Used  Substance Use Topics   Alcohol use: Never   Drug use: Never     Allergies   Augmentin [amoxicillin-pot clavulanate], Bee pollen, Latex, and Penicillins   Review of Systems Review of Systems  Constitutional:  Negative for chills and fever.  HENT:  Positive for ear pain and sore throat.   Eyes:  Negative for pain and visual disturbance.  Respiratory:  Negative for cough and shortness of breath.   Cardiovascular:  Negative for chest pain and palpitations.  Gastrointestinal:  Negative for abdominal pain and vomiting.  Genitourinary:  Negative for dysuria and hematuria.  Musculoskeletal:  Negative for arthralgias and back pain.  Skin:  Negative for color change and rash.  Neurological:  Positive for dizziness. Negative for seizures and syncope.  All other systems reviewed and are negative.    Physical Exam Triage Vital Signs ED Triage Vitals [07/13/23 1738]  Encounter Vitals Group     BP 115/76     Girls Systolic BP Percentile      Girls Diastolic BP Percentile      Boys Systolic BP Percentile      Boys Diastolic BP Percentile      Pulse Rate 72     Resp 16     Temp 98.3 F (36.8 C)     Temp Source Oral     SpO2 97 %     Weight      Height      Head Circumference       Peak Flow      Pain Score 6     Pain Loc      Pain Education      Exclude from Growth Chart    No data found.  Updated Vital Signs BP 115/76 (BP Location: Left Arm)   Pulse 72   Temp 98.3 F (36.8 C) (Oral)   Resp 16   LMP 06/28/2023   SpO2 97%   Visual Acuity Right Eye Distance:   Left Eye Distance:   Bilateral Distance:    Right Eye Near:  Left Eye Near:    Bilateral Near:     Physical Exam Vitals and nursing note reviewed.  Constitutional:      General: She is not in acute distress.    Appearance: She is well-developed.  HENT:     Head: Normocephalic and atraumatic.     Right Ear: Ear canal normal. A middle ear effusion is present. There is no impacted cerumen. Tympanic membrane is not injected or erythematous.     Left Ear: Ear canal normal. A middle ear effusion is present. There is no impacted cerumen. Tympanic membrane is not injected or erythematous.     Mouth/Throat:     Mouth: Mucous membranes are moist.     Pharynx: Posterior oropharyngeal erythema present.  Eyes:     Conjunctiva/sclera: Conjunctivae normal.  Cardiovascular:     Rate and Rhythm: Normal rate and regular rhythm.     Heart sounds: No murmur heard. Pulmonary:     Effort: Pulmonary effort is normal. No respiratory distress.     Breath sounds: Normal breath sounds.  Abdominal:     Palpations: Abdomen is soft.     Tenderness: There is no abdominal tenderness.  Musculoskeletal:        General: No swelling.     Cervical back: Neck supple.  Skin:    General: Skin is warm and dry.     Capillary Refill: Capillary refill takes less than 2 seconds.  Neurological:     Mental Status: She is alert.  Psychiatric:        Mood and Affect: Mood normal.      UC Treatments / Results  Labs (all labs ordered are listed, but only abnormal results are displayed) Labs Reviewed - No data to display  EKG   Radiology No results found.  Procedures Procedures (including critical care  time)  Medications Ordered in UC Medications - No data to display  Initial Impression / Assessment and Plan / UC Course  I have reviewed the triage vital signs and the nursing notes.  Pertinent labs & imaging results that were available during my care of the patient were reviewed by me and considered in my medical decision making (see chart for details).     Otalgia of left ear  Non-recurrent acute suppurative otitis media of left ear without spontaneous rupture of tympanic membrane  Eustachian tube disorder, left  Dizziness   Left ear pain likely due to early left otitis media vs a eustachian tube inflammation. This is likely causing the dizziness as well.  Reassuringly, the vital signs are within normal limits and the physical exam does not show any neurological deficit.  Given the symptoms and physical exam findings we will treat with the following:  Azithromycin  250mg  Take 2 tablets today and the 1 tablet daily for 4 more days. Prednisone  40 mg (2 tablets) once daily for 5 days. Take this in the morning.  This is a steroid to help with inflammation and pain.  Use tylenol  for pain or fever but avoid using ibuprofen  while on the prednisone  Rest and stay hydrated.  Return to urgent care or PCP if symptoms worsen or fail to resolve.    Final Clinical Impressions(s) / UC Diagnoses   Final diagnoses:  Otalgia of left ear  Non-recurrent acute suppurative otitis media of left ear without spontaneous rupture of tympanic membrane  Eustachian tube disorder, left  Dizziness     Discharge Instructions      Left ear pain likely due to early left  otitis media vs a eustachian tube inflammation. This is likely causing the dizziness as well.  Reassuringly, the vital signs are within normal limits and the physical exam does not show any neurological deficit.  Given the symptoms and physical exam findings we will treat with the following:  Azithromycin  250mg  Take 2 tablets today and the 1  tablet daily for 4 more days. Prednisone  40 mg (2 tablets) once daily for 5 days. Take this in the morning.  This is a steroid to help with inflammation and pain.  Use tylenol  for pain or fever but avoid using ibuprofen  while on the prednisone  Rest and stay hydrated.  Return to urgent care or PCP if symptoms worsen or fail to resolve.      ED Prescriptions     Medication Sig Dispense Auth. Provider   azithromycin  (ZITHROMAX ) 250 MG tablet Take first 2 tablets together, then 1 every day until finished. 6 tablet Bryston Colocho A, PA-C   predniSONE  (DELTASONE ) 20 MG tablet Take 2 tablets (40 mg total) by mouth daily with breakfast for 5 days. 10 tablet Teresa Almarie LABOR, [place]      PDMP not reviewed this encounter.   Teresa Almarie LABOR, PA-C 07/13/23 1830

## 2023-07-13 NOTE — Discharge Instructions (Addendum)
 Left ear pain likely due to early left otitis media vs a eustachian tube inflammation. This is likely causing the dizziness as well.  Reassuringly, the vital signs are within normal limits and the physical exam does not show any neurological deficit.  Given the symptoms and physical exam findings we will treat with the following:  Azithromycin  250mg  Take 2 tablets today and the 1 tablet daily for 4 more days. Prednisone  40 mg (2 tablets) once daily for 5 days. Take this in the morning.  This is a steroid to help with inflammation and pain.  Use tylenol  for pain or fever but avoid using ibuprofen  while on the prednisone  Rest and stay hydrated.  Return to urgent care or PCP if symptoms worsen or fail to resolve.

## 2023-07-13 NOTE — ED Triage Notes (Signed)
 Patient reports that she has had left ear pain since yesterday. Patient reports dizziness that started today. Patient states today she began seeing gray spots at times.  Patient's mother states that she put Vick's in her left ear for pain.
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
## 2023-07-25 ENCOUNTER — Ambulatory Visit (INDEPENDENT_AMBULATORY_CARE_PROVIDER_SITE_OTHER): Admitting: Psychiatry

## 2023-07-25 DIAGNOSIS — F4310 Post-traumatic stress disorder, unspecified: Secondary | ICD-10-CM | POA: Diagnosis not present

## 2023-07-25 DIAGNOSIS — F330 Major depressive disorder, recurrent, mild: Secondary | ICD-10-CM

## 2023-07-25 MED ORDER — PROPRANOLOL HCL 10 MG PO TABS: 10.0000 mg | ORAL_TABLET | Freq: Three times a day (TID) | ORAL | 1 refills | Status: DC | PRN

## 2023-07-25 MED ORDER — PRAZOSIN HCL 1 MG PO CAPS
1.0000 mg | ORAL_CAPSULE | Freq: Every day | ORAL | 1 refills | Status: DC
Start: 2023-07-25 — End: 2023-09-05

## 2023-07-25 MED ORDER — HYDROXYZINE HCL 25 MG PO TABS
25.0000 mg | ORAL_TABLET | Freq: Two times a day (BID) | ORAL | 2 refills | Status: DC | PRN
Start: 2023-07-25 — End: 2023-09-05

## 2023-07-25 MED ORDER — VENLAFAXINE HCL ER 75 MG PO CP24
75.0000 mg | ORAL_CAPSULE | Freq: Every day | ORAL | 1 refills | Status: DC
Start: 2023-07-25 — End: 2023-09-05

## 2023-07-28 ENCOUNTER — Ambulatory Visit: Admitting: Family Medicine

## 2023-07-28 ENCOUNTER — Encounter: Payer: Self-pay | Admitting: Family Medicine

## 2023-07-28 ENCOUNTER — Ambulatory Visit: Payer: Self-pay | Admitting: Family Medicine

## 2023-07-28 VITALS — BP 116/80 | HR 78 | Temp 98.7°F | Ht 62.0 in | Wt 196.8 lb

## 2023-07-28 DIAGNOSIS — H66004 Acute suppurative otitis media without spontaneous rupture of ear drum, recurrent, right ear: Secondary | ICD-10-CM

## 2023-07-28 DIAGNOSIS — Z8659 Personal history of other mental and behavioral disorders: Secondary | ICD-10-CM

## 2023-07-28 DIAGNOSIS — J4520 Mild intermittent asthma, uncomplicated: Secondary | ICD-10-CM | POA: Diagnosis not present

## 2023-07-28 DIAGNOSIS — F411 Generalized anxiety disorder: Secondary | ICD-10-CM

## 2023-07-28 DIAGNOSIS — Z79899 Other long term (current) drug therapy: Secondary | ICD-10-CM | POA: Diagnosis not present

## 2023-07-28 DIAGNOSIS — F330 Major depressive disorder, recurrent, mild: Secondary | ICD-10-CM | POA: Diagnosis not present

## 2023-07-28 DIAGNOSIS — F4310 Post-traumatic stress disorder, unspecified: Secondary | ICD-10-CM

## 2023-07-28 DIAGNOSIS — R000 Tachycardia, unspecified: Secondary | ICD-10-CM

## 2023-07-28 DIAGNOSIS — I479 Paroxysmal tachycardia, unspecified: Secondary | ICD-10-CM | POA: Insufficient documentation

## 2023-07-28 DIAGNOSIS — E559 Vitamin D deficiency, unspecified: Secondary | ICD-10-CM

## 2023-07-28 DIAGNOSIS — E038 Other specified hypothyroidism: Secondary | ICD-10-CM

## 2023-07-28 LAB — CBC WITH DIFFERENTIAL/PLATELET
Basophils Absolute: 0 K/uL (ref 0.0–0.1)
Basophils Relative: 0.3 % (ref 0.0–3.0)
Eosinophils Absolute: 0.1 K/uL (ref 0.0–0.7)
Eosinophils Relative: 1.3 % (ref 0.0–5.0)
HCT: 41.1 % (ref 36.0–46.0)
Hemoglobin: 14.1 g/dL (ref 12.0–15.0)
Lymphocytes Relative: 27.7 % (ref 12.0–46.0)
Lymphs Abs: 2 K/uL (ref 0.7–4.0)
MCHC: 34.3 g/dL (ref 30.0–36.0)
MCV: 89.3 fl (ref 78.0–100.0)
Monocytes Absolute: 0.5 K/uL (ref 0.1–1.0)
Monocytes Relative: 6.6 % (ref 3.0–12.0)
Neutro Abs: 4.6 K/uL (ref 1.4–7.7)
Neutrophils Relative %: 64.1 % (ref 43.0–77.0)
Platelets: 196 K/uL (ref 150.0–400.0)
RBC: 4.6 Mil/uL (ref 3.87–5.11)
RDW: 12.7 % (ref 11.5–15.5)
WBC: 7.1 K/uL (ref 4.0–10.5)

## 2023-07-28 LAB — COMPREHENSIVE METABOLIC PANEL WITH GFR
ALT: 14 U/L (ref 0–35)
AST: 16 U/L (ref 0–37)
Albumin: 4.4 g/dL (ref 3.5–5.2)
Alkaline Phosphatase: 57 U/L (ref 39–117)
BUN: 16 mg/dL (ref 6–23)
CO2: 21 meq/L (ref 19–32)
Calcium: 8.8 mg/dL (ref 8.4–10.5)
Chloride: 108 meq/L (ref 96–112)
Creatinine, Ser: 0.72 mg/dL (ref 0.40–1.20)
GFR: 116.63 mL/min (ref >60.00)
Glucose, Bld: 94 mg/dL (ref 70–99)
Potassium: 3.9 meq/L (ref 3.5–5.1)
Sodium: 137 meq/L (ref 135–145)
Total Bilirubin: 0.5 mg/dL (ref 0.2–1.2)
Total Protein: 6.9 g/dL (ref 6.0–8.3)

## 2023-07-28 LAB — VITAMIN B12: Vitamin B-12: 510 pg/mL (ref 211–911)

## 2023-07-28 LAB — TSH: TSH: 8.21 u[IU]/mL — ABNORMAL HIGH (ref 0.35–5.50)

## 2023-07-28 LAB — VITAMIN D 25 HYDROXY (VIT D DEFICIENCY, FRACTURES): VITD: 15.98 ng/mL — ABNORMAL LOW (ref 30.00–100.00)

## 2023-07-28 MED ORDER — VITAMIN D (ERGOCALCIFEROL) 1.25 MG (50000 UNIT) PO CAPS: 50000.0000 [IU] | ORAL_CAPSULE | ORAL | 0 refills | Status: DC

## 2023-07-28 MED ORDER — LEVOTHYROXINE SODIUM 25 MCG PO TABS
25.0000 ug | ORAL_TABLET | Freq: Every day | ORAL | 2 refills | Status: AC
Start: 2023-07-28 — End: ?

## 2023-07-28 MED ORDER — CEFDINIR 300 MG PO CAPS
300.0000 mg | ORAL_CAPSULE | Freq: Two times a day (BID) | ORAL | 0 refills | Status: DC
Start: 2023-07-28 — End: 2023-08-29

## 2023-07-28 MED ORDER — ALBUTEROL SULFATE HFA 108 (90 BASE) MCG/ACT IN AERS
1.0000 | INHALATION_SPRAY | Freq: Four times a day (QID) | RESPIRATORY_TRACT | Status: DC | PRN
Start: 2023-07-28 — End: 2023-08-29

## 2023-07-28 NOTE — Progress Notes (Signed)
 New Patient Office Visit  Subjective    Patient ID: Tiffany [name], female    DOB: 04-09-2000  Age: 23 y.o. MRN: [id number]  CC: No chief complaint on file.   HPI [name] presents to establish care. Patient reports that she was recently evaluated at urgent care for left ear pain where she was prescribed azithromycin  and prednisone . She states that she has completed therapy but has not noted any marked improvement. She denies ear drainage, fever, chills, sore throat, or cough.  Patient also reports that she has been experiencing intermittent palpitations for a long time. She reports that she first noticed the palpitations when she was in high school; however, she feels like she is having more frequent palpitations now. She states she is having 1-3 episodes daily and each episode self-resolves within several minutes. She denies chest pain, dyspnea, or other associated symptoms. She denies knowledge of any contributing factors. States she drinks 1-2 energy drinks per month but consumes no other caffeine.   Outpatient Encounter Medications as of 07/28/2023  Medication Sig   cefdinir (OMNICEF) 300 MG capsule Take 1 capsule (300 mg total) by mouth 2 (two) times daily for 10 days.   hydrOXYzine  (ATARAX ) 25 MG tablet Take 1 tablet (25 mg total) by mouth 2 (two) times daily as needed for anxiety. May take 25 mg three times daily as needed for anxiety and 50 mg at bedtime as needed for sleep.   olopatadine  (PATANOL) 0.1 % ophthalmic solution Place 1 drop into the right eye 2 (two) times daily.   prazosin  (MINIPRESS ) 1 MG capsule Take 1 capsule (1 mg total) by mouth at bedtime.   propranolol  (INDERAL ) 10 MG tablet Take 1 tablet (10 mg total) by mouth 3 (three) times daily as needed (anxiety).   venlafaxine  XR (EFFEXOR  XR) 75 MG 24 hr capsule Take 1 capsule (75 mg total) by mouth daily with breakfast.   [DISCONTINUED] albuterol  (VENTOLIN  HFA) 108 (90 Base) MCG/ACT inhaler Inhale 1-2 puffs into  the lungs every 6 (six) hours as needed for wheezing or shortness of breath.   albuterol  (VENTOLIN  HFA) 108 (90 Base) MCG/ACT inhaler Inhale 1-2 puffs into the lungs every 6 (six) hours as needed for wheezing or shortness of breath.   azithromycin  (ZITHROMAX ) 250 MG tablet Take first 2 tablets together, then 1 every day until finished. (Patient not taking: Reported on 07/28/2023)   No facility-administered encounter medications on file as of 07/28/2023.    Past Medical History:  Diagnosis Date   ADHD (attention deficit hyperactivity disorder) 01/03/2002   Took Adderall for several years, then Dexedrine, now Strattera   Adjustment disorder with mixed anxiety and depressed mood 04/14/2014   Asthma since infancy   triggers more in winter, has had bronchitis/pneumonia   Dyslexia    MDD (major depressive disorder), recurrent episode, severe (HCC) 04/17/2022   MDD (major depressive disorder), recurrent, in partial remission (HCC) 07/01/2022   Mild depression 07/12/2021   Mood disorder (HCC) 01/04/2011    Past Surgical History:  Procedure Laterality Date   ADENOIDECTOMY     OTHER SURGICAL HISTORY     tubes in the ears   TONSILLECTOMY     TYMPANOSTOMY TUBE PLACEMENT      Family History  Problem Relation Age of Onset   Learning disabilities Mother    Mental illness Mother    Drug abuse Mother    Mental illness Father    Heart disease Father    Learning disabilities Brother  Heart disease Maternal Grandmother    Hypertension Maternal Grandmother    Learning disabilities Maternal Grandmother    Depression Maternal Grandmother    Anxiety disorder Maternal Grandmother    Learning disabilities Maternal Grandfather    Heart murmur Maternal Grandfather    Diabetes Maternal Uncle    Alcohol abuse Neg Hx    Clotting disorder Neg Hx     Social History   Socioeconomic History   Marital status: Single    Spouse name: Not on file   Number of children: Not on file   Years of  education: Not on file   Highest education level: Not on file  Occupational History   Not on file  Tobacco Use   Smoking status: Never    Passive exposure: Current   Smokeless tobacco: Never   Tobacco comments:    Both grandparents chain smokers; mom vapes  Vaping Use   Vaping status: Never Used  Substance and Sexual Activity   Alcohol use: Never   Drug use: Never   Sexual activity: Not Currently  Other Topics Concern   Not on file  Social History Narrative   Not on file   Social Drivers of Health   Financial Resource Strain: Not on file  Food Insecurity: No Food Insecurity (04/17/2022)   Hunger Vital Sign    Worried About Running Out of Food in the Last Year: Never true    Ran Out of Food in the Last Year: Never true  Transportation Needs: No Transportation Needs (04/17/2022)   PRAPARE - Administrator, Civil Service (Medical): No    Lack of Transportation (Non-Medical): No  Physical Activity: Not on file  Stress: Not on file  Social Connections: Not on file  Intimate Partner Violence: Not At Risk (04/17/2022)   Humiliation, Afraid, Rape, and Kick questionnaire    Fear of Current or Ex-Partner: No    Emotionally Abused: No    Physically Abused: No    Sexually Abused: No    ROS See HPI     Objective    Pulse 78   Temp 98.7 F (37.1 C) (Temporal)   Ht 5' 2 (1.575 m)   Wt 196 lb 12.8 oz (89.3 kg)   LMP 06/28/2023   SpO2 99%   BMI 36.00 kg/m   Physical Exam Vitals reviewed.  Constitutional:      Appearance: Normal appearance.  HENT:     Head: Normocephalic and atraumatic.     Right Ear: Tympanic membrane is scarred.     Left Ear: Tympanic membrane is erythematous.     Nose: Nose normal.  Cardiovascular:     Rate and Rhythm: Normal rate and regular rhythm.     Pulses: Normal pulses.     Heart sounds: Normal heart sounds.  Pulmonary:     Effort: Pulmonary effort is normal.     Breath sounds: Normal breath sounds.  Skin:    General:  Skin is warm and dry.     Capillary Refill: Capillary refill takes less than 2 seconds.  Neurological:     Mental Status: She is alert and oriented to person, place, and time.  Psychiatric:        Mood and Affect: Mood normal.        Behavior: Behavior normal.       Assessment & Plan:   Problem List Items Addressed This Visit       Respiratory   Mild intermittent asthma   Stable with no recent  flares/exacerbations. Albuterol  MDI refill sent.      Relevant Medications   albuterol  (VENTOLIN  HFA) 108 (90 Base) MCG/ACT inhaler   Other Relevant Orders   Ambulatory referral to Allergy   CBC with Differential/Platelet   Comprehensive metabolic panel with GFR     Other   History of ADHD   Relevant Orders   CBC with Differential/Platelet   Comprehensive metabolic panel with GFR   GAD (generalized anxiety disorder)   Stable on propranolol . Continue same.       PTSD (post-traumatic stress disorder) - Primary   MDD (major depressive disorder), recurrent episode, mild (HCC)   Relevant Orders   VITAMIN D 25 Hydroxy (Vit-D Deficiency, Fractures)   Other Visit Diagnoses       Tachycardia       Relevant Orders   Ambulatory referral to Cardiology   TSH   Vitamin B12     Recurrent acute suppurative otitis media of right ear without spontaneous rupture of tympanic membrane       Relevant Medications   cefdinir (OMNICEF) 300 MG capsule   Other Relevant Orders   CBC with Differential/Platelet   Comprehensive metabolic panel with GFR     Medication management       Relevant Orders   CBC with Differential/Platelet   Comprehensive metabolic panel with GFR   VITAMIN D 25 Hydroxy (Vit-D Deficiency, Fractures)   TSH   Vitamin B12       Return if symptoms worsen or fail to improve.   Debby CHRISTELLA Borer, RN

## 2023-07-28 NOTE — Progress Notes (Signed)
 New Patient Visit  Subjective:     Patient ID: Tiffany [name], female    DOB: Aug 23, 2000, 23 y.o.   MRN: [id number]  No chief complaint on file.   HPI  Discussed the use of AI scribe software for clinical note transcription with the patient, who gave verbal consent to proceed.  History of Present Illness Tiffany [name] is a 23 year old female with POTS and hypermobility who presents for an albuterol  refill and evaluation of her symptoms. She is accompanied by her mother.  Tachycardia and autonomic dysfunction - Episodes of elevated heart rate, with a recent peak at 130 beats per minute - Tachycardia occurs sporadically, including early morning episodes - Uses propranolol  as needed, especially in public settings, to manage anxiety and heart rate  Joint hypermobility and musculoskeletal pain - Hypermobility leads to joint pain and instability  Restless legs and sleep disturbance - Restless legs and difficulty sleeping due to leg discomfort - Uses a Squishmallow for comfort during sleep  Respiratory symptoms and medication refill - Requires albuterol  refill to be sent to [facility] at [place]  Recurrent otitis media - History of recurrent ear infections - Previously treated with ear tubes and recent antibiotics  Allergic reactions - Allergies to Augmentin, penicillin, bee pollen, and latex, causing rash  Post-traumatic stress disorder and nightmares - Manages PTSD with prazosin  at night, which alleviates nightmares     ROS Per HPI  Outpatient Encounter Medications as of 07/28/2023  Medication Sig   cefdinir (OMNICEF) 300 MG capsule Take 1 capsule (300 mg total) by mouth 2 (two) times daily for 10 days.   hydrOXYzine  (ATARAX ) 25 MG tablet Take 1 tablet (25 mg total) by mouth 2 (two) times daily as needed for anxiety. May take 25 mg three times daily as needed for anxiety and 50 mg at bedtime as needed for sleep.   olopatadine  (PATANOL) 0.1 % ophthalmic solution Place  1 drop into the right eye 2 (two) times daily.   prazosin  (MINIPRESS ) 1 MG capsule Take 1 capsule (1 mg total) by mouth at bedtime.   propranolol  (INDERAL ) 10 MG tablet Take 1 tablet (10 mg total) by mouth 3 (three) times daily as needed (anxiety).   venlafaxine  XR (EFFEXOR  XR) 75 MG 24 hr capsule Take 1 capsule (75 mg total) by mouth daily with breakfast.   [DISCONTINUED] albuterol  (VENTOLIN  HFA) 108 (90 Base) MCG/ACT inhaler Inhale 1-2 puffs into the lungs every 6 (six) hours as needed for wheezing or shortness of breath.   albuterol  (VENTOLIN  HFA) 108 (90 Base) MCG/ACT inhaler Inhale 1-2 puffs into the lungs every 6 (six) hours as needed for wheezing or shortness of breath.   azithromycin  (ZITHROMAX ) 250 MG tablet Take first 2 tablets together, then 1 every day until finished. (Patient not taking: Reported on 07/28/2023)   No facility-administered encounter medications on file as of 07/28/2023.    Past Medical History:  Diagnosis Date   ADHD (attention deficit hyperactivity disorder) 01/03/2002   Took Adderall for several years, then Dexedrine, now Strattera   Adjustment disorder with mixed anxiety and depressed mood 04/14/2014   Asthma since infancy   triggers more in winter, has had bronchitis/pneumonia   Dyslexia    MDD (major depressive disorder), recurrent episode, severe (HCC) 04/17/2022   MDD (major depressive disorder), recurrent, in partial remission (HCC) 07/01/2022   Mild depression 07/12/2021   Mood disorder (HCC) 01/04/2011    Past Surgical History:  Procedure Laterality Date   ADENOIDECTOMY  OTHER SURGICAL HISTORY     tubes in the ears   TONSILLECTOMY     TYMPANOSTOMY TUBE PLACEMENT      Family History  Problem Relation Age of Onset   Learning disabilities Mother    Mental illness Mother    Drug abuse Mother    Mental illness Father    Heart disease Father    Learning disabilities Brother    Heart disease Maternal Grandmother    Hypertension Maternal  Grandmother    Learning disabilities Maternal Grandmother    Depression Maternal Grandmother    Anxiety disorder Maternal Grandmother    Learning disabilities Maternal Grandfather    Heart murmur Maternal Grandfather    Diabetes Maternal Uncle    Alcohol abuse Neg Hx    Clotting disorder Neg Hx     Social History   Socioeconomic History   Marital status: Single    Spouse name: Not on file   Number of children: Not on file   Years of education: Not on file   Highest education level: Not on file  Occupational History   Not on file  Tobacco Use   Smoking status: Never    Passive exposure: Current   Smokeless tobacco: Never   Tobacco comments:    Both grandparents chain smokers; mom vapes  Vaping Use   Vaping status: Never Used  Substance and Sexual Activity   Alcohol use: Never   Drug use: Never   Sexual activity: Not Currently  Other Topics Concern   Not on file  Social History Narrative   Not on file   Social Drivers of Health   Financial Resource Strain: Not on file  Food Insecurity: No Food Insecurity (04/17/2022)   Hunger Vital Sign    Worried About Running Out of Food in the Last Year: Never true    Ran Out of Food in the Last Year: Never true  Transportation Needs: No Transportation Needs (04/17/2022)   PRAPARE - Administrator, Civil Service (Medical): No    Lack of Transportation (Non-Medical): No  Physical Activity: Not on file  Stress: Not on file  Social Connections: Not on file  Intimate Partner Violence: Not At Risk (04/17/2022)   Humiliation, Afraid, Rape, and Kick questionnaire    Fear of Current or Ex-Partner: No    Emotionally Abused: No    Physically Abused: No    Sexually Abused: No       Objective:    Pulse 78   Temp 98.7 F (37.1 C) (Temporal)   Ht 5' 2 (1.575 m)   Wt 196 lb 12.8 oz (89.3 kg)   LMP 06/28/2023   SpO2 99%   BMI 36.00 kg/m    Physical Exam Vitals and nursing note reviewed.  Constitutional:       General: She is not in acute distress.    Appearance: Normal appearance. She is normal weight.  HENT:     Head: Normocephalic and atraumatic.     Right Ear: External ear normal. A middle ear effusion is present. Tympanic membrane is erythematous and bulging.     Left Ear: External ear normal. A middle ear effusion is present. Tympanic membrane is not erythematous or bulging.     Nose: Nose normal.     Mouth/Throat:     Mouth: Mucous membranes are moist.     Pharynx: Oropharynx is clear.  Eyes:     Extraocular Movements: Extraocular movements intact.     Pupils: Pupils are equal, round,  and reactive to light.  Cardiovascular:     Rate and Rhythm: Normal rate and regular rhythm.     Pulses: Normal pulses.     Heart sounds: Normal heart sounds.  Pulmonary:     Effort: Pulmonary effort is normal. No respiratory distress.     Breath sounds: Normal breath sounds. No wheezing, rhonchi or rales.  Musculoskeletal:        General: Normal range of motion.     Cervical back: Normal range of motion.     Right lower leg: No edema.     Left lower leg: No edema.  Lymphadenopathy:     Cervical: No cervical adenopathy.  Neurological:     General: No focal deficit present.     Mental Status: She is alert and oriented to person, place, and time.  Psychiatric:        Mood and Affect: Mood normal.        Thought Content: Thought content normal.     No results found for any visits on 07/28/23.      Assessment & Plan:   Assessment and Plan Assessment & Plan POTS (Postural Orthostatic Tachycardia Syndrome) Elevated heart rate upon standing, managed with propranolol . Low sodium levels contribute to symptoms. Recommended increased salt and electrolyte intake. Formal cardiology evaluation advised. - Continue propranolol  as needed for anxiety and heart rate control. - Increase salt intake to help manage heart rate. - Encourage fluid intake with electrolytes. - Consider referral to cardiology for  further evaluation.  Hypermobility Syndrome Associated with POTS, contributing to joint issues and balance problems. Physical therapy may improve joint stability and cardiac endurance. - Consider physical therapy to strengthen muscles around hypermobile joints.  Double Ear Infections History of recurrent infections and ear tubes.  PTSD (Post-Traumatic Stress Disorder) Prazosin  effective for nightmares, though full efficacy may take time. - Continue prazosin  for PTSD-related nightmares.  Contact Dermatitis Facial redness and burning consistent with contact dermatitis, likely allergic reaction.  Allergies Allergic to Augmentin, penicillin, bee pollen, bee stings, and latex. Discussed potential outgrowing of penicillin allergy. - Refer to allergist for allergy testing.  Family History of Blood Clots Father's early death from blood clot raises cardiovascular concerns. - Refer to cardiology for evaluation of cardiovascular health.  General Health Maintenance Monitoring electrolytes and sodium levels important due to POTS symptoms and salt intake. - Order lab tests to check electrolytes and sodium levels.  Follow-up Use MyChart for communication and follow-up on lab results. - Use MyChart for follow-up and communication. - Review lab results and provide feedback through MyChart. - Visit the lab downstairs for blood tests.     Orders Placed This Encounter  Procedures   CBC with Differential/Platelet    Release to patient:   Immediate [1]   Comprehensive metabolic panel with GFR    Release to patient:   Immediate [1]   VITAMIN D 25 Hydroxy (Vit-D Deficiency, Fractures)   TSH   Vitamin B12   Ambulatory referral to Cardiology    Referral Priority:   Routine    Referral Type:   Consultation    Referral Reason:   Specialty Services Required    Number of Visits Requested:   1   Ambulatory referral to Allergy    Referral Priority:   Routine    Referral Type:   Allergy Testing     Referral Reason:   Specialty Services Required    Requested Specialty:   Allergy    Number of Visits Requested:   1  Meds ordered this encounter  Medications   albuterol  (VENTOLIN  HFA) 108 (90 Base) MCG/ACT inhaler    Sig: Inhale 1-2 puffs into the lungs every 6 (six) hours as needed for wheezing or shortness of breath.   cefdinir (OMNICEF) 300 MG capsule    Sig: Take 1 capsule (300 mg total) by mouth 2 (two) times daily for 10 days.    Dispense:  20 capsule    Refill:  0    Return in 6 months (on 01/28/2024), or if symptoms worsen or fail to improve, for meds.  Corean LITTIE Ku, FNP

## 2023-07-28 NOTE — Assessment & Plan Note (Signed)
 Stable with no recent flares/exacerbations. Albuterol  MDI refill sent.

## 2023-07-28 NOTE — Patient Instructions (Addendum)
 Welcome to [facility]!  Thank you for choosing us  for your Primary Care needs.   We offer in person and video appointments for your convenience. You may call our office to schedule appointments, or you may schedule appointments with me through MyChart.   The best way to get in contact with me is via MyChart message. This will get to me faster than a phone call, unless there is an emergency, then please call 911.  The lab is located downstairs in the Sports Medicine building, we also have xray available there.   Follow-up with me in 6 mos for medication management, sooner if needed.

## 2023-07-28 NOTE — Assessment & Plan Note (Signed)
 Stable on propranolol . Continue same.

## 2023-08-01 ENCOUNTER — Ambulatory Visit (INDEPENDENT_AMBULATORY_CARE_PROVIDER_SITE_OTHER): Admitting: Clinical

## 2023-08-01 ENCOUNTER — Encounter (HOSPITAL_COMMUNITY): Payer: Self-pay

## 2023-08-01 DIAGNOSIS — F330 Major depressive disorder, recurrent, mild: Secondary | ICD-10-CM

## 2023-08-01 NOTE — Progress Notes (Signed)
   THERAPIST PROGRESS NOTE  Session Time: 45 minutes  Participation Level: Active  Behavioral Response: CasualAlertEuthymic  Type of Therapy: Individual Therapy  Treatment Goals addressed:  [name] WILL SCORE LESS THAN 10 ON THE PATIENT HEALTH QUESTIONNAIRE (PHQ-9)   ProgressTowards Goals: Progressing  Interventions: CBT and Supportive  Summary:  Tiffany [name] is a 23 y.o. female who presents for the scheduled appointment oriented times five, appropriately dressed and friendly. Client denied hallucinations and delusions. Client reported she has been stressed. Client reported she went to see a PCP with [facility]. Client reported she was told she may have POTS. Client reported she also has some other things going on with her heart and low vitamins. Client reported she will be going to specialist to double check the PCP findings. Client reported she is anxious about moving forward with catching up on her health. Client reported she hasn't told other family members about her health yet because she doesn't want to worry anyone. Client reported otherwise dealing with her friend group she has been navigating. Client reported their has been drama but she has been keeping her boundaries. Client reported on today she rates her depression a 4/10. Client reported she has spouts of depression but she is able to do positive activities to help her work through it. Evidence of progress towards goal:  client PHQ-9 score is 10 or under. Flowsheet Row Office Visit from 07/28/2023 in [facility] [place]  PHQ-9 Total Score 10     Suicidal/Homicidal: Nowithout intent/plan  Therapist Response:  Therapist began the appointment asking the client how she has been doing. Therapist engaged with active listening and positive emotional support. Therapist used cbt to engage and ask the client about changes that have occurred. Therapist used cbt to engage and positively reinforce her  making health appointments and aide her in reframing her fear of health updates. Therapist updated SDOH. Therapist used CBT ask the client to identify her progress with frequency of use with coping skills with continued practice in her daily activity.    Therapist assigned the client homework to practice self care.   Plan: Return again in 4 weeks.  Diagnosis: MDD, recurrent, mild  Collaboration of Care: Patient refused AEB none requested by the client.  Patient/Guardian was advised Release of Information must be obtained prior to any record release in order to collaborate their care with an outside provider. Patient/Guardian was advised if they have not already done so to contact the registration department to sign all necessary forms in order for us  to release information regarding their care.   Consent: Patient/Guardian gives verbal consent for treatment and assignment of benefits for services provided during this visit. Patient/Guardian expressed understanding and agreed to proceed.   Enyla Lisbon Y Trelon Plush, LCSW 08/01/2023

## 2023-08-22 ENCOUNTER — Ambulatory Visit (INDEPENDENT_AMBULATORY_CARE_PROVIDER_SITE_OTHER): Admitting: Clinical

## 2023-08-22 DIAGNOSIS — F330 Major depressive disorder, recurrent, mild: Secondary | ICD-10-CM | POA: Diagnosis not present

## 2023-08-22 NOTE — Progress Notes (Signed)
   THERAPIST PROGRESS NOTE  Session Time: 50 min  Participation Level: Active  Behavioral Response: CasualAlertEuthymic  Type of Therapy: Individual Therapy  Treatment Goals addressed: Shaylah WILL IDENTIFY 3 COGNITIVE PATTERNS AND BELIEFS THAT SUPPORT DEPRESSION   ProgressTowards Goals: Progressing  Interventions: CBT and Supportive  Summary:  Tiffany [name] is a 23 y.o. female who presents for the scheduled appointment oriented x 5, appropriately dressed, extremity.  Client denied hallucinations and delusions. Client reported on today she is doing well.  Client reported she is follow-up with doctors about her thyroid  and she was put on medication.  Client reported she will be going for an allergy test next.  Client reported otherwise she has been in a good mental space. Client reported she has had better hygiene since last seen now showering every two days. Client reported she has also been washing her face and doing her make up most days. Client reported she has been enjoying spending times with her friends. Client reported she enjoys her time being more intentional on doing things she enjoys. Client reported she also has been enjoying cleaning her space and making room to bring her outdoor plants inside once it gets cooler.  Client reported she has enjoyed being on the schedule because it helps to keep her balance.  Client reported when she is off with schedule she becomes imbalanced and being inconsistent with taking her medications. Evidence of progress towards goal: Client reported 2 positives of putting herself on a schedule and having improved self-care regimen/hygiene.  Suicidal/Homicidal: Nowithout intent/plan  Therapist Response:  Therapist began the appointment asking client how she has been doing since last seen. Therapist engaged with active listening and positive emotional support. Therapist used CBT to engage and give client time to discuss changes that have  occurred. Therapist used CBT to positively reinforce the clients engagement with routine health screenings, positive activity engagement and improved personal hygiene practices. Therapist used CBT to engage with the client in discussing management of routine and incorporating it to make it a pleasant lifestyle change. Therapist used CBT ask the client to identify her progress with frequency of use with coping skills with continued practice in her daily activity.      Plan: Return again in 4 weeks.  Diagnosis: mdd, recurrent, mild  Collaboration of Care: Patient refused AEB none requested by the client.  Patient/Guardian was advised Release of Information must be obtained prior to any record release in order to collaborate their care with an outside provider. Patient/Guardian was advised if they have not already done so to contact the registration department to sign all necessary forms in order for us  to release information regarding their care.   Consent: Patient/Guardian gives verbal consent for treatment and assignment of benefits for services provided during this visit. Patient/Guardian expressed understanding and agreed to proceed.   Jonmichael Beadnell Y Kimesha Claxton, LCSW 08/22/2023

## 2023-08-25 ENCOUNTER — Telehealth (HOSPITAL_COMMUNITY): Payer: Self-pay

## 2023-08-25 NOTE — Telephone Encounter (Signed)
 Hello,    Pt's mother is requesting a Bridge of medications until her next appointment. Mother states she has been out of the MINIPRESS  for quite some time, and has to check with daughter about rest of meds.   JNL

## 2023-08-28 ENCOUNTER — Encounter: Payer: Self-pay | Admitting: Family Medicine

## 2023-08-29 ENCOUNTER — Other Ambulatory Visit: Payer: Self-pay

## 2023-08-29 DIAGNOSIS — J4520 Mild intermittent asthma, uncomplicated: Secondary | ICD-10-CM

## 2023-08-29 MED ORDER — ALBUTEROL SULFATE HFA 108 (90 BASE) MCG/ACT IN AERS
1.0000 | INHALATION_SPRAY | Freq: Four times a day (QID) | RESPIRATORY_TRACT | 0 refills | Status: DC | PRN
Start: 2023-08-29 — End: 2023-10-05
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
## 2023-09-05 ENCOUNTER — Ambulatory Visit (INDEPENDENT_AMBULATORY_CARE_PROVIDER_SITE_OTHER): Admitting: Psychiatry

## 2023-09-05 DIAGNOSIS — F4310 Post-traumatic stress disorder, unspecified: Secondary | ICD-10-CM

## 2023-09-05 DIAGNOSIS — F330 Major depressive disorder, recurrent, mild: Secondary | ICD-10-CM | POA: Diagnosis not present

## 2023-09-05 MED ORDER — PROPRANOLOL HCL 10 MG PO TABS: 10.0000 mg | ORAL_TABLET | Freq: Every day | ORAL | 2 refills | Status: DC | PRN

## 2023-09-05 MED ORDER — VENLAFAXINE HCL ER 75 MG PO CP24
75.0000 mg | ORAL_CAPSULE | Freq: Every day | ORAL | 2 refills | Status: DC
Start: 2023-09-05 — End: 2023-10-17

## 2023-09-05 MED ORDER — PRAZOSIN HCL 1 MG PO CAPS
1.0000 mg | ORAL_CAPSULE | Freq: Every day | ORAL | 2 refills | Status: DC
Start: 2023-09-05 — End: 2023-10-17

## 2023-09-05 MED ORDER — HYDROXYZINE HCL 25 MG PO TABS
25.0000 mg | ORAL_TABLET | Freq: Two times a day (BID) | ORAL | 2 refills | Status: DC | PRN
Start: 2023-09-05 — End: 2023-10-17

## 2023-09-06 NOTE — Addendum Note (Signed)
 Addended by: CARVIN CROCK on: 09/06/2023 01:06 PM   Modules accepted: Level of Service

## 2023-09-06 NOTE — Addendum Note (Signed)
 Addended by: CARVIN CROCK on: 09/06/2023 11:21 AM   Modules accepted: Level of Service

## 2023-09-07 NOTE — Progress Notes (Unsigned)
 New Patient Note  RE: Tiffany [name] MRN: [id number] DOB: 05/26/2000 Date of Office Visit: 09/08/2023  Consult requested by: Alvia Corean CROME, * Primary care provider: Alvia Corean CROME, FNP  Chief Complaint: No chief complaint on file.  History of Present Illness: I had the pleasure of seeing Tiffany [name] for initial evaluation at the [facility] on 09/08/2023. She is a 23 y.o. female, who is referred here by Alvia Corean CROME, * for the evaluation of allergies.  Discussed the use of AI scribe software for clinical note transcription with the patient, who gave verbal consent to proceed.  History of Present Illness             07/28/2023 PCP visit: Allergies Allergic to Augmentin, penicillin, bee pollen, bee stings, and latex. Discussed potential outgrowing of penicillin allergy. - Refer to allergist for allergy testing.  Assessment and Plan: Tiffany [name] is a 23 y.o. female with: ***  Assessment and Plan               No follow-ups on file.  No orders of the defined types were placed in this encounter.  Lab Orders  No laboratory test(s) ordered today    Other allergy screening: Asthma: {Blank single:19197::yes,no} Rhino conjunctivitis: {Blank single:19197::yes,no} Food allergy: {Blank single:19197::yes,no} Medication allergy: {Blank single:19197::yes,no} Hymenoptera allergy: {Blank single:19197::yes,no} Urticaria: {Blank single:19197::yes,no} Eczema:{Blank single:19197::yes,no} History of recurrent infections suggestive of immunodeficency: {Blank single:19197::yes,no}  Diagnostics: Spirometry:  Tracings reviewed. Her effort: {Blank single:19197::Good reproducible efforts.,It was hard to get consistent efforts and there is a question as to whether this reflects a maximal maneuver.,Poor effort, data can not be interpreted.} FVC: ***L FEV1: ***L, ***% predicted FEV1/FVC ratio:  ***% Interpretation: {Blank single:19197::Spirometry consistent with mild obstructive disease,Spirometry consistent with moderate obstructive disease,Spirometry consistent with severe obstructive disease,Spirometry consistent with possible restrictive disease,Spirometry consistent with mixed obstructive and restrictive disease,Spirometry uninterpretable due to technique,Spirometry consistent with normal pattern,No overt abnormalities noted given today's efforts}.  Please see scanned spirometry results for details.  Skin Testing: {Blank single:19197::Select foods,Environmental allergy panel,Environmental allergy panel and select foods,Food allergy panel,None,Deferred due to recent antihistamines use}. *** Results discussed with patient/family.   Past Medical History: Patient Active Problem List   Diagnosis Date Noted  . Mild intermittent asthma 07/28/2023  . Tachycardia 07/28/2023  . Recurrent acute suppurative otitis media of right ear without spontaneous rupture of tympanic membrane 07/28/2023  . Medication management 07/28/2023  . MDD (major depressive disorder), recurrent episode, mild (HCC) 05/17/2023  . GAD (generalized anxiety disorder) 04/18/2022  . PTSD (post-traumatic stress disorder) 04/18/2022  . Bilateral hip pain 04/20/2015  . History of self-harm 04/14/2014  . Dry eyes 01/08/2014  . Dyslexia 12/06/2013  . Dysmenorrhea in adolescent 11/12/2013  . Surveillance of previously prescribed contraceptive method 11/12/2013  . History of ADHD 09/04/2013  . Asthma, chronic 09/04/2013   Past Medical History:  Diagnosis Date  . ADHD (attention deficit hyperactivity disorder) 01/03/2002   Took Adderall for several years, then Dexedrine, now Strattera  . Adjustment disorder with mixed anxiety and depressed mood 04/14/2014  . Asthma since infancy   triggers more in winter, has had bronchitis/pneumonia  . Dyslexia   . MDD (major depressive disorder),  recurrent episode, severe (HCC) 04/17/2022  . MDD (major depressive disorder), recurrent, in partial remission (HCC) 07/01/2022  . Mild depression 07/12/2021  . Mood disorder (HCC) 01/04/2011   Past Surgical History: Past Surgical History:  Procedure Laterality Date  . ADENOIDECTOMY    . OTHER SURGICAL  HISTORY     tubes in the ears  . TONSILLECTOMY    . TYMPANOSTOMY TUBE PLACEMENT     Medication List:  Current Outpatient Medications  Medication Sig Dispense Refill  . albuterol  (VENTOLIN  HFA) 108 (90 Base) MCG/ACT inhaler Inhale 1-2 puffs into the lungs every 6 (six) hours as needed for wheezing or shortness of breath. 18 g 0  . hydrOXYzine  (ATARAX ) 25 MG tablet Take 1 tablet (25 mg total) by mouth 2 (two) times daily as needed for anxiety. May take 25 mg three times daily as needed for anxiety and 50 mg at bedtime as needed for sleep. 45 tablet 2  . levothyroxine  (SYNTHROID ) 25 MCG tablet Take 1 tablet (25 mcg total) by mouth daily. 30 tablet 2  . olopatadine  (PATANOL) 0.1 % ophthalmic solution Place 1 drop into the right eye 2 (two) times daily. 5 mL 0  . prazosin  (MINIPRESS ) 1 MG capsule Take 1 capsule (1 mg total) by mouth at bedtime. 30 capsule 2  . propranolol  (INDERAL ) 10 MG tablet Take 1 tablet (10 mg total) by mouth daily as needed (anxiety). 30 tablet 2  . venlafaxine  XR (EFFEXOR  XR) 75 MG 24 hr capsule Take 1 capsule (75 mg total) by mouth daily with breakfast. 30 capsule 2  . Vitamin D , Ergocalciferol , (DRISDOL ) 1.25 MG (50000 UNIT) CAPS capsule Take 1 capsule (50,000 Units total) by mouth every 7 (seven) days. 8 capsule 0   No current facility-administered medications for this visit.   Allergies: Allergies  Allergen Reactions  . Augmentin [Amoxicillin-Pot Clavulanate] Hives and Other (See Comments)       . Bee Pollen   . Latex   . Penicillins Rash   Social History: Social History   Socioeconomic History  . Marital status: Single    Spouse name: Not on file  .  Number of children: Not on file  . Years of education: Not on file  . Highest education level: Not on file  Occupational History  . Not on file  Tobacco Use  . Smoking status: Never    Passive exposure: Current  . Smokeless tobacco: Never  . Tobacco comments:    Both grandparents chain smokers; mom vapes  Vaping Use  . Vaping status: Never Used  Substance and Sexual Activity  . Alcohol use: Never  . Drug use: Never  . Sexual activity: Not Currently  Other Topics Concern  . Not on file  Social History Narrative  . Not on file   Social Drivers of Health   Financial Resource Strain: Not on file  Food Insecurity: No Food Insecurity (04/17/2022)   Hunger Vital Sign   . Worried About Programme researcher, broadcasting/film/video in the Last Year: Never true   . Ran Out of Food in the Last Year: Never true  Transportation Needs: No Transportation Needs (04/17/2022)   PRAPARE - Transportation   . Lack of Transportation (Medical): No   . Lack of Transportation (Non-Medical): No  Physical Activity: Not on file  Stress: Not on file  Social Connections: Not on file   Lives in a ***. Smoking: *** Occupation: ***  Environmental HistorySurveyor, minerals in the house: Network engineer in the family room: {Blank single:19197::yes,no} Carpet in the bedroom: {Blank single:19197::yes,no} Heating: {Blank single:19197::electric,gas,heat pump} Cooling: {Blank single:19197::central,window,heat pump} Pet: {Blank single:19197::yes ***,no}  Family History: Family History  Problem Relation Age of Onset  . Learning disabilities Mother   . Mental illness Mother   . Drug abuse Mother   .  Mental illness Father   . Heart disease Father   . Learning disabilities Brother   . Heart disease Maternal Grandmother   . Hypertension Maternal Grandmother   . Learning disabilities Maternal Grandmother   . Depression Maternal Grandmother   . Anxiety disorder Maternal  Grandmother   . Learning disabilities Maternal Grandfather   . Heart murmur Maternal Grandfather   . Diabetes Maternal Uncle   . Alcohol abuse Neg Hx   . Clotting disorder Neg Hx    Problem                               Relation Asthma                                   *** Eczema                                *** Food allergy                          *** Allergic rhino conjunctivitis     ***  Review of Systems  Constitutional:  Negative for appetite change, chills, fever and unexpected weight change.  HENT:  Negative for congestion and rhinorrhea.   Eyes:  Negative for itching.  Respiratory:  Negative for cough, chest tightness, shortness of breath and wheezing.   Cardiovascular:  Negative for chest pain.  Gastrointestinal:  Negative for abdominal pain.  Genitourinary:  Negative for difficulty urinating.  Skin:  Negative for rash.  Neurological:  Negative for headaches.    Objective: There were no vitals taken for this visit. There is no height or weight on file to calculate BMI. Physical Exam Vitals and nursing note reviewed.  Constitutional:      Appearance: Normal appearance. She is well-developed.  HENT:     Head: Normocephalic and atraumatic.     Right Ear: Tympanic membrane and external ear normal.     Left Ear: Tympanic membrane and external ear normal.     Nose: Nose normal.     Mouth/Throat:     Mouth: Mucous membranes are moist.     Pharynx: Oropharynx is clear.  Eyes:     Conjunctiva/sclera: Conjunctivae normal.  Cardiovascular:     Rate and Rhythm: Normal rate and regular rhythm.     Heart sounds: Normal heart sounds. No murmur heard.    No friction rub. No gallop.  Pulmonary:     Effort: Pulmonary effort is normal.     Breath sounds: Normal breath sounds. No wheezing, rhonchi or rales.  Musculoskeletal:     Cervical back: Neck supple.  Skin:    General: Skin is warm.     Findings: No rash.  Neurological:     Mental Status: She is alert and oriented  to person, place, and time.  Psychiatric:        Behavior: Behavior normal.   The plan was reviewed with the patient/family, and all questions/concerned were addressed.  It was my pleasure to see Tiffany [name] today and participate in her care. Please feel free to contact me with any questions or concerns.  Sincerely,  Orlan Cramp, DO Allergy & Immunology  [facility]  [facility] office: [phone] [facility] office: [phone]

## 2023-09-08 ENCOUNTER — Ambulatory Visit (INDEPENDENT_AMBULATORY_CARE_PROVIDER_SITE_OTHER): Admitting: Allergy

## 2023-09-08 ENCOUNTER — Other Ambulatory Visit: Payer: Self-pay

## 2023-09-08 ENCOUNTER — Encounter: Payer: Self-pay | Admitting: Allergy

## 2023-09-08 VITALS — BP 120/84 | HR 81 | Temp 98.5°F | Resp 18 | Ht 61.81 in | Wt 196.4 lb

## 2023-09-08 DIAGNOSIS — K219 Gastro-esophageal reflux disease without esophagitis: Secondary | ICD-10-CM

## 2023-09-08 DIAGNOSIS — Z880 Allergy status to penicillin: Secondary | ICD-10-CM

## 2023-09-08 DIAGNOSIS — E739 Lactose intolerance, unspecified: Secondary | ICD-10-CM

## 2023-09-08 DIAGNOSIS — K9049 Malabsorption due to intolerance, not elsewhere classified: Secondary | ICD-10-CM | POA: Diagnosis not present

## 2023-09-08 DIAGNOSIS — B999 Unspecified infectious disease: Secondary | ICD-10-CM

## 2023-09-08 DIAGNOSIS — R21 Rash and other nonspecific skin eruption: Secondary | ICD-10-CM | POA: Diagnosis not present

## 2023-09-08 DIAGNOSIS — J3089 Other allergic rhinitis: Secondary | ICD-10-CM | POA: Diagnosis not present

## 2023-09-08 DIAGNOSIS — J4520 Mild intermittent asthma, uncomplicated: Secondary | ICD-10-CM | POA: Diagnosis not present

## 2023-09-08 MED ORDER — AZELASTINE HCL 0.1 % NA SOLN: 1.0000 | Freq: Two times a day (BID) | NASAL | 2 refills | Status: DC | PRN

## 2023-09-08 MED ORDER — OMEPRAZOLE MAGNESIUM 20 MG PO TBEC: 20.0000 mg | DELAYED_RELEASE_TABLET | Freq: Every day | ORAL | 2 refills | Status: DC

## 2023-09-08 NOTE — Patient Instructions (Addendum)
 Rhinitis  Return for allergy skin testing. Will make additional recommendations based on results. Make sure you don't take any antihistamines for 3 days before the skin testing appointment. Don't put any lotion on the back and arms on the day of testing.  Must be in good health and not ill. No vaccines/injections/antibiotics within the past 7 days.  Plan on being here for 30-60 minutes.  Use azelastine  nasal spray 1-2 sprays per nostril twice a day as needed for runny nose/drainage. Stop 3 days before.  You also have to stop hydroxyzine  3 days before skin testing. If you don't think you can stop it then let us  know.   Rash Keep track of rashes and take pictures.  Foods  Continue to avoid known triggers - hot sauce, tomatoes.  Discussed with patient and mother that skin prick testing and bloodwork (food IgE levels) checks for IgE mediated reactions which her clinical presentation does not support.   Allergy: food allergy is when you have eaten a food, developed an allergic reaction after eating the food and have IgE to the food (positive food testing either by skin testing or blood testing).  Food allergy could lead to life threatening symptoms Sensitivity: occurs when you have IgE to a food (positive food testing either by skin testing or blood testing) but is a food you eat without any issues.  This is not an allergy and we recommend keeping the food in the diet Intolerance: this is when you have negative testing by either skin testing or blood testing thus not allergic but the food causes symptoms (like belly pain, bloating, diarrhea etc) with ingestion.  These foods should be avoided to prevent symptoms.     Lactose intolerance May use lactose free milk or take a lactaid pill right before consuming anything with dairy.  Breathing Avoid known triggers - cinnamon, live christmas trees.  May use albuterol  rescue inhaler 2 puffs every 4 to 6 hours as needed for shortness of breath, chest  tightness, coughing, and wheezing. May use albuterol  rescue inhaler 2 puffs 5 to 15 minutes prior to strenuous physical activities. Monitor frequency of use - if you need to use it more than twice per week on a consistent basis let us  know.  Breathing control goals:  Full participation in all desired activities (may need albuterol  before activity) Albuterol  use two times or less a week on average (not counting use with activity) Cough interfering with sleep two times or less a month Oral steroids no more than once a year No hospitalizations   Reflux See handout for lifestyle and dietary modifications. Start omeprazole  20mg  once day - nothing to eat or drink for 20-30 minutes afterwards.   Penicillin allergy: I'm going to remove the penicillin allergy from your list as you tolerated Augmentin recently with no issues.  Infections Keep track of infections and antibiotics use. If persistent will get bloodwork next to look at immune system.    Follow up for skin testing.

## 2023-09-12 ENCOUNTER — Ambulatory Visit (INDEPENDENT_AMBULATORY_CARE_PROVIDER_SITE_OTHER): Admitting: Clinical

## 2023-09-12 DIAGNOSIS — F330 Major depressive disorder, recurrent, mild: Secondary | ICD-10-CM | POA: Diagnosis not present

## 2023-09-12 NOTE — Progress Notes (Unsigned)
   THERAPIST PROGRESS NOTE  Session Time: 45 min  Participation Level: Active  Behavioral Response: CasualAlertEuthymic  Type of Therapy: Individual Therapy  Treatment Goals addressed: client will engage ina t least 80% of scheduled individual psychotherapy sessions  ProgressTowards Goals: Progressing  Interventions: CBT  Summary:  Tiffany [name] is a 23 y.o. female who presents for the scheduled appointment oriented times five, appropriately dressed and friendly. Client denied hallucinations and delusions. Client reported she is doing well today. Client reported she has been decorating the house for the fall/ halloween season. Client reported she has gone to the doctor to follow up on her allergy  tests. Client reported she was diagnosed with GERD and Rhinitis. Client reported she was depressed because of potential outcomes from having both conditions. Client reported otherwise she continues to work with coping on grief. Client reported sometimes she sees certain things related to fathers and becomes sad thinking about how she will not have him around for important moments in her life. Client reported she makes jokes to help lighten the mood. Client reported she also visualizes the future and how she can incorporate her dad in meaningful ways. Client reported she also has a ritual she enjoys which includes making a day of the dead alter to honor he cat whom she loved and her other loved ones. Client reported things are going well with her boyfriend. Client reported no other complains. Evidence of progress towards goal:  client reported 1 positive of having rituals to help her cope with grief.  Suicidal/Homicidal: Nowithout intent/plan  Therapist Response:  Therapist began the appointment asking the client how she has been doing. Therapist engaged with active listening and positive emotional support. Therapist used cbt to engage and give her time to discuss her thoughts. Therapist used  cbt to normalize her emotions pertaining to her health. Therapist used cbt to discuss coping skills.   Plan: Return again in 4 weeks.  Diagnosis: mdd, recurrent episode, mild  Collaboration of Care: Patient refused AEB none requested by the client.  Patient/Guardian was advised Release of Information must be obtained prior to any record release in order to collaborate their care with an outside provider. Patient/Guardian was advised if they have not already done so to contact the registration department to sign all necessary forms in order for us  to release information regarding their care.   Consent: Patient/Guardian gives verbal consent for treatment and assignment of benefits for services provided during this visit. Patient/Guardian expressed understanding and agreed to proceed.   Michela Herst Y Jaequan Propes, LCSW 09/12/2023

## 2023-09-15 ENCOUNTER — Encounter: Payer: Self-pay | Admitting: Allergy

## 2023-09-15 ENCOUNTER — Ambulatory Visit: Admitting: Allergy

## 2023-09-15 DIAGNOSIS — B999 Unspecified infectious disease: Secondary | ICD-10-CM

## 2023-09-15 DIAGNOSIS — T781XXD Other adverse food reactions, not elsewhere classified, subsequent encounter: Secondary | ICD-10-CM | POA: Diagnosis not present

## 2023-09-15 DIAGNOSIS — J3089 Other allergic rhinitis: Secondary | ICD-10-CM | POA: Diagnosis not present

## 2023-09-15 DIAGNOSIS — K9049 Malabsorption due to intolerance, not elsewhere classified: Secondary | ICD-10-CM

## 2023-09-15 DIAGNOSIS — E739 Lactose intolerance, unspecified: Secondary | ICD-10-CM

## 2023-09-15 NOTE — Progress Notes (Signed)
 Skin testing note  RE: Tiffany [name] MRN: [id number] DOB: 12/05/2000 Date of Office Visit: 09/15/2023  Referring provider: Alvia Corean CROME, * Primary care provider: Alvia Corean CROME, FNP  Chief Complaint: skin testing  History of Present Illness: I had the pleasure of seeing Tiffany [name] for a skin testing visit at the [facility] on 09/15/2023. She is a 23 y.o. female, who is being followed for allergic rhinitis, food intolerance, asthma, rash, recurrent infections, GERD. Her previous allergy  office visit was on 09/08/2023 with Dr. Luke. Today is a skin testing visit.  She is accompanied today by her mother who provided/contributed to the history.   Discussed the use of AI scribe software for clinical note transcription with the patient, who gave verbal consent to proceed.     She used azelastine  nasal spray as needed. Initially, the spray caused sneezing and has a bad aftertaste, but it helps with nasal drainage.  She did not start omeprazole  yet.     Assessment and Plan: Tiffany [name] is a 23 y.o. female with: Other allergic rhinitis Past history - reports itching and respiratory symptoms with exposure to allergens. Dog saliva causes skin reactions. Previous medications were beneficial.  Today's skin prick testing borderline to grass and one mold. Get bloodwork to double check results due to your sensitive skin.  Start environmental control measures as below. Use over the counter antihistamines such as Zyrtec  (cetirizine ), Claritin (loratadine), Allegra (fexofenadine), or Xyzal (levocetirizine) daily as needed. May take twice a day during allergy  flares. May switch antihistamines every few months. Use azelastine  nasal spray 1-2 sprays per nostril twice a day as needed for runny nose/drainage. Nasal saline spray (i.e., Simply Saline) or nasal saline lavage (i.e., NeilMed) is recommended as needed and prior to medicated nasal sprays.   Food  Past history -  Facial redness from hot sauce likely due to vasodilation. Mucus after tomatoes possibly related to GERD. Lactaid alleviates lactose intolerance symptoms. Today's skin prick testing negative to cinnamon and tomato. Get bloodwork as well.  Continue to avoid known triggers - hot sauce, tomatoes.  May use lactose free milk or take a lactaid pill right before consuming anything with dairy.   Mild intermittent asthma without complication Past history - Asthma triggered by environmental factors and activity. Rare Ventolin  use indicates well-controlled asthma. 2025 spirometry was not interpretable due to poor effort. Avoid known triggers - cinnamon, live christmas trees.  May use albuterol  rescue inhaler 2 puffs every 4 to 6 hours as needed for shortness of breath, chest tightness, coughing, and wheezing. May use albuterol  rescue inhaler 2 puffs 5 to 15 minutes prior to strenuous physical activities. Monitor frequency of use - if you need to use it more than twice per week on a consistent basis let us  know.    Rash and other nonspecific skin eruption Keep track of rashes and take pictures.   Recurrent infections Past history - frequent ear infections even as an adult. Had tubes as a child. Lives with smokers inside the home. Keep track of infections and antibiotics use. Get bloodwork next to look at immune system.    Gastroesophageal reflux disease, unspecified whether esophagitis present Past history - Symptoms suggestive of GERD. Spicy foods and tomatoes may worsen reflux. Continue lifestyle and dietary modifications. Omeprazole  20mg  once day - nothing to eat or drink for 20-30 minutes afterwards.   Return in about 3 months (around 12/15/2023).  No orders of the defined types were placed in this  encounter.  Lab Orders         Allergens w/Total IgE Area 2         Tomato IgE         Allergen, Cinnamon [id number]         CBC with Differential/Platelet         IgG 1, 2, 3, and 4         Strep  pneumoniae 23 Serotypes IgG         Diphtheria / Tetanus Antibody Panel      Diagnostics: Skin Testing: Environmental allergy  panel and select foods. Today's skin testing borderline to grass and one mold. Negative to cinnamon and tomato.  Results discussed with patient/family.  Airborne Adult Perc - 09/15/23 0924     Time Antigen Placed 9075    Allergen Manufacturer Jestine    Location Back    Number of Test 55    1. Control-Buffer 50% Glycerol Negative    2. Control-Histamine 3+    3. Bahia Negative    4. French Southern Territories Negative    5. Johnson Negative    6. Kentucky  Blue Negative    7. Meadow Fescue Negative    8. Perennial Rye Negative    9. Timothy --   +/-   10. Ragweed Mix Negative    11. Cocklebur Negative    12. Plantain,  English Negative    13. Baccharis Negative    14. Dog Fennel Negative    15. Russian Thistle Negative    16. Lamb's Quarters Negative    17. Sheep Sorrell Negative    18. Rough Pigweed Negative    19. Marsh Elder, Rough Negative    20. Mugwort, Common Negative    21. Box, Elder Negative    22. Cedar, red Negative    23. Sweet Gum Negative    24. Pecan Pollen Negative    25. Pine Mix Negative    26. Walnut, Black Pollen Negative    27. Red Mulberry Negative    28. Ash Mix Negative    29. Birch Mix Negative    30. Beech American Negative    31. Cottonwood, Guinea-Bissau Negative    32. Hickory, White Negative    33. Maple Mix Negative    34. Oak, Guinea-[place]iss[place] Mix Negative    35. Sycamore Eastern Negative    36. Alternaria Alternata Negative    37. Cladosporium Herbarum Negative    38. Aspergillus Mix Negative    39. Penicillium Mix Negative    40. Bipolaris Sorokiniana (Helminthosporium) Negative    41. Drechslera Spicifera (Curvularia) Negative    42. Mucor Plumbeus Negative    43. Fusarium Moniliforme Negative    44. Aureobasidium Pullulans (pullulara) Negative    45. Rhizopus Oryzae Negative    46. Botrytis Cinera Negative    47. Epicoccum Nigrum --    +/-   48. Phoma Betae Negative    49. Dust Mite Mix Negative    50. Cat Hair 10,000 BAU/ml Negative    51.  Dog Epithelia Negative    52. Mixed Feathers Negative    53. Horse Epithelia Negative    54. Cockroach, German Negative    55. Tobacco Leaf Negative          Food Adult Perc - 09/15/23 0900     Time Antigen Placed 9075    Allergen Manufacturer Jestine    Location Back    Number of allergen test 2    38. Tomato Negative  67. Cinnamon Negative          Previous notes and tests were reviewed. The plan was reviewed with the patient/family, and all questions/concerned were addressed.  It was my pleasure to see Asmara today and participate in her care. Please feel free to contact me with any questions or concerns.  Sincerely,  Orlan Cramp, DO Allergy  & Immunology  [facility]  [place] office: [phone] [facility] office: [phone]

## 2023-09-15 NOTE — Patient Instructions (Addendum)
 Today's skin testing borderline to grass and one mold. Negative to cinnamon and tomato.   Get bloodwork to double check results due to your sensitive skin.   Results given.  Environmental allergies Start environmental control measures as below. Use over the counter antihistamines such as Zyrtec  (cetirizine ), Claritin (loratadine), Allegra (fexofenadine), or Xyzal (levocetirizine) daily as needed. May take twice a day during allergy  flares. May switch antihistamines every few months. Use azelastine  nasal spray 1-2 sprays per nostril twice a day as needed for runny nose/drainage. Nasal saline spray (i.e., Simply Saline) or nasal saline lavage (i.e., NeilMed) is recommended as needed and prior to medicated nasal sprays.  Rash Keep track of rashes and take pictures.  Foods  Continue to avoid known triggers - hot sauce, tomatoes.   Allergy : food allergy  is when you have eaten a food, developed an allergic reaction after eating the food and have IgE to the food (positive food testing either by skin testing or blood testing).  Food allergy  could lead to life threatening symptoms Sensitivity: occurs when you have IgE to a food (positive food testing either by skin testing or blood testing) but is a food you eat without any issues.  This is not an allergy  and we recommend keeping the food in the diet Intolerance: this is when you have negative testing by either skin testing or blood testing thus not allergic but the food causes symptoms (like belly pain, bloating, diarrhea etc) with ingestion.  These foods should be avoided to prevent symptoms.     Lactose intolerance May use lactose free milk or take a lactaid pill right before consuming anything with dairy.  Breathing Avoid known triggers - cinnamon, live christmas trees.  May use albuterol  rescue inhaler 2 puffs every 4 to 6 hours as needed for shortness of breath, chest tightness, coughing, and wheezing. May use albuterol  rescue inhaler 2  puffs 5 to 15 minutes prior to strenuous physical activities. Monitor frequency of use - if you need to use it more than twice per week on a consistent basis let us  know.  Breathing control goals:  Full participation in all desired activities (may need albuterol  before activity) Albuterol  use two times or less a week on average (not counting use with activity) Cough interfering with sleep two times or less a month Oral steroids no more than once a year No hospitalizations   Reflux Continue lifestyle and dietary modifications. Omeprazole  20mg  once day - nothing to eat or drink for 20-30 minutes afterwards.   Infections Keep track of infections and antibiotics use. Get bloodwork next to look at immune system.   Return in about 3 months (around 12/15/2023). Or sooner if needed.   Reducing Pollen Exposure Pollen seasons: trees (spring), grass (summer) and ragweed/weeds (fall). Keep windows closed in your home and car to lower pollen exposure.  Install air conditioning in the bedroom and throughout the house if possible.  Avoid going out in dry windy days - especially early morning. Pollen counts are highest between 5 - 10 AM and on dry, hot and windy days.  Save outside activities for late afternoon or after a heavy rain, when pollen levels are lower.  Avoid mowing of grass if you have grass pollen allergy . Be aware that pollen can also be transported indoors on people and pets.  Dry your clothes in an automatic dryer rather than hanging them outside where they might collect pollen.  Rinse hair and eyes before bedtime.  Mold Control Mold and fungi can  grow on a variety of surfaces provided certain temperature and moisture conditions exist.  Outdoor molds grow on plants, decaying vegetation and soil. The major outdoor mold, Alternaria and Cladosporium, are found in very high numbers during hot and dry conditions. Generally, a late summer - fall peak is seen for common outdoor fungal  spores. Rain will temporarily lower outdoor mold spore count, but counts rise rapidly when the rainy period ends. The most important indoor molds are Aspergillus and Penicillium. Dark, humid and poorly ventilated basements are ideal sites for mold growth. The next most common sites of mold growth are the bathroom and the kitchen. Outdoor (Seasonal) Mold Control Use air conditioning and keep windows closed. Avoid exposure to decaying vegetation. Avoid leaf raking. Avoid grain handling. Consider wearing a face mask if working in moldy areas.  Indoor (Perennial) Mold Control  Maintain humidity below 50%. Get rid of mold growth on hard surfaces with water, detergent and, if necessary, 5% bleach (do not mix with other cleaners). Then dry the area completely. If mold covers an area more than 10 square feet, consider hiring an indoor environmental professional. For clothing, washing with soap and water is best. If moldy items cannot be cleaned and dried, throw them away. Remove sources e.g. contaminated carpets. Repair and seal leaking roofs or pipes. Using dehumidifiers in damp basements may be helpful, but empty the water and clean units regularly to prevent mildew from forming. All rooms, especially basements, bathrooms and kitchens, require ventilation and cleaning to deter mold and mildew growth. Avoid carpeting on concrete or damp floors, and storing items in damp areas.

## 2023-09-22 ENCOUNTER — Ambulatory Visit: Payer: Self-pay | Admitting: Allergy

## 2023-09-22 LAB — CBC WITH DIFFERENTIAL/PLATELET
Basophils Absolute: 0 x10E3/uL (ref 0.0–0.2)
Basos: 1 %
EOS (ABSOLUTE): 0 x10E3/uL (ref 0.0–0.4)
Eos: 1 %
Hematocrit: 39.3 % (ref 34.0–46.6)
Hemoglobin: 13.1 g/dL (ref 11.1–15.9)
Immature Grans (Abs): 0 x10E3/uL (ref 0.0–0.1)
Immature Granulocytes: 0 %
Lymphocytes Absolute: 1.9 x10E3/uL (ref 0.7–3.1)
Lymphs: 31 %
MCH: 30.9 pg (ref 26.6–33.0)
MCHC: 33.3 g/dL (ref 31.5–35.7)
MCV: 93 fL (ref 79–97)
Monocytes Absolute: 0.4 x10E3/uL (ref 0.1–0.9)
Monocytes: 7 %
Neutrophils Absolute: 3.7 x10E3/uL (ref 1.4–7.0)
Neutrophils: 60 %
Platelets: 216 x10E3/uL (ref 150–450)
RBC: 4.24 x10E6/uL (ref 3.77–5.28)
RDW: 12.3 % (ref 11.7–15.4)
WBC: 6.1 x10E3/uL (ref 3.4–10.8)

## 2023-09-22 LAB — STREP PNEUMONIAE 23 SEROTYPES IGG
Pneumo Ab Type 1*: 0.4 ug/mL — AB (ref >1.3)
Pneumo Ab Type 12 (12F)*: <0.1 ug/mL — AB (ref >1.3)
Pneumo Ab Type 14*: 0.1 ug/mL — AB (ref >1.3)
Pneumo Ab Type 17 (17F)*: 0.9 ug/mL — AB (ref >1.3)
Pneumo Ab Type 19 (19F)*: 0.4 ug/mL — AB (ref >1.3)
Pneumo Ab Type 2*: <0.2 ug/mL — AB (ref >1.3)
Pneumo Ab Type 20*: 0.4 ug/mL — AB (ref >1.3)
Pneumo Ab Type 22 (22F)*: <0.1 ug/mL — AB (ref >1.3)
Pneumo Ab Type 23 (23F)*: 1.2 ug/mL — AB (ref >1.3)
Pneumo Ab Type 26 (6B)*: <0.1 ug/mL — AB (ref >1.3)
Pneumo Ab Type 3*: <0.1 ug/mL — AB (ref >1.3)
Pneumo Ab Type 34 (10A)*: 0.1 ug/mL — AB (ref >1.3)
Pneumo Ab Type 4*: <0.1 ug/mL — AB (ref >1.3)
Pneumo Ab Type 43 (11A)*: 0.7 ug/mL — AB (ref >1.3)
Pneumo Ab Type 5*: 0.2 ug/mL — AB (ref >1.3)
Pneumo Ab Type 51 (7F)*: <0.1 ug/mL — AB (ref >1.3)
Pneumo Ab Type 54 (15B)*: <0.2 ug/mL — AB (ref >1.3)
Pneumo Ab Type 56 (18C)*: 0.2 ug/mL — AB (ref >1.3)
Pneumo Ab Type 57 (19A)*: 0.7 ug/mL — AB (ref >1.3)
Pneumo Ab Type 68 (9V)*: 1 ug/mL — AB (ref >1.3)
Pneumo Ab Type 70 (33F)*: 0.7 ug/mL — AB (ref >1.3)
Pneumo Ab Type 8*: <0.3 ug/mL — AB (ref >1.3)
Pneumo Ab Type 9 (9N)*: <0.1 ug/mL — AB (ref >1.3)

## 2023-09-22 LAB — IGG 1, 2, 3, AND 4
IgG (Immunoglobin G), Serum: 868 mg/dL (ref 586–1602)
IgG, Subclass 1: 332 mg/dL (ref 248–810)
IgG, Subclass 2: 428 mg/dL (ref 130–555)
IgG, Subclass 3: 31 mg/dL (ref 15–102)
IgG, Subclass 4: 12 mg/dL (ref 2–96)

## 2023-09-22 LAB — ALLERGENS W/TOTAL IGE AREA 2
Alternaria Alternata IgE: <0.1 kU/L
Aspergillus Fumigatus IgE: <0.1 kU/L
Bermuda Grass IgE: <0.1 kU/L
Cat Dander IgE: <0.1 kU/L
Cedar, Mountain IgE: <0.1 kU/L
Cladosporium Herbarum IgE: <0.1 kU/L
Cockroach, German IgE: <0.1 kU/L
Common Silver Birch IgE: <0.1 kU/L
Cottonwood IgE: <0.1 kU/L
D Farinae IgE: <0.1 kU/L
D Pteronyssinus IgE: 0.11 kU/L — AB
Dog Dander IgE: <0.1 kU/L
Elm, American IgE: <0.1 kU/L
IgE (Immunoglobulin E), Serum: 10 [IU]/mL (ref 6–495)
Johnson Grass IgE: <0.1 kU/L
Maple/Box Elder IgE: <0.1 kU/L
Mouse Urine IgE: <0.1 kU/L
Oak, White IgE: <0.1 kU/L
Pecan, Hickory IgE: <0.1 kU/L
Penicillium Chrysogen IgE: <0.1 kU/L
Pigweed, Rough IgE: <0.1 kU/L
Ragweed, Short IgE: <0.1 kU/L
Sheep Sorrel IgE Qn: <0.1 kU/L
Timothy Grass IgE: <0.1 kU/L
White Mulberry IgE: <0.1 kU/L

## 2023-09-22 LAB — DIPHTHERIA / TETANUS ANTIBODY PANEL
Diphtheria Ab: 0.29 [IU]/mL (ref <0.10)
Tetanus Ab, IgG: 0.26 [IU]/mL (ref <0.10)

## 2023-09-22 LAB — ALLERGEN, CINNAMON, RF220: Allergen Cinnamon IgE: <0.1 kU/L

## 2023-09-22 LAB — ALLERGEN, TOMATO F25: Allergen Tomato, IgE: <0.1 kU/L

## 2023-09-25 ENCOUNTER — Other Ambulatory Visit: Payer: Self-pay | Admitting: Family Medicine

## 2023-09-25 DIAGNOSIS — E559 Vitamin D deficiency, unspecified: Secondary | ICD-10-CM

## 2023-09-26 ENCOUNTER — Ambulatory Visit (INDEPENDENT_AMBULATORY_CARE_PROVIDER_SITE_OTHER): Admitting: Clinical

## 2023-09-26 DIAGNOSIS — F330 Major depressive disorder, recurrent, mild: Secondary | ICD-10-CM | POA: Diagnosis not present

## 2023-09-26 NOTE — Progress Notes (Unsigned)
   THERAPIST PROGRESS NOTE  Session Time: 45 min  Participation Level: Active  Behavioral Response: CasualAlertEuthymic  Type of Therapy: Individual Therapy  Treatment Goals addressed: client will engage in at least 80% of scheduled individual psychotherapy sessions  ProgressTowards Goals: Progressing  Interventions: CBT  Summary:  Tiffany [name] is a 23 y.o. female who presents oriented times five, appropriately dressed and friendly. Client denied hallucinations, delusions, suicidal and homicidal ideations. Client reported this past week has been good. Client reported she has been decorating for the halloween holiday. Client reported she enjoys that. Client reported the neighborhood kids have been back to bothering their decorations in their yard. Client reported otherwise anxiety has been stable and depression has come in waves. Client reported her psychiatrist wont touch her medication because it was just changed. Client reported she mood swings and gets snappy. Client reported sometimes she can say something kind of mean to her brother when he does not take her que to leave or end a conversation. Client reported she does feel bad about sometimes. Client reported she is somewhat anxious about the holidays because of how her family has arguments. Client reported her grandmother is usually the source of the problem. 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 Client reported she is hoping her boyfriend will come visit her this holiday season. Client reported she completed an allergy  test.  Evidence of progress towards goal:  client reported 1 positive of being able to manage her depression and anxiety.    Suicidal/Homicidal: Nowithout intent/plan  Therapist Response:  Therapist began the appointment asking the client how she has been doing. Therapist engaged using active listening and positive emotional support. Therapist used cbt to give the client time to discuss his thoughts and  feelings. Therapist used cbt to engage with her to discuss challenges and reinforce the use of positive coping skills. Therapist used CBT ask the client to identify her progress with frequency of use with coping skills with continued practice in her daily activity.    Therapist assigned her homework to practice self care.    Plan: Return again in 4 weeks.  Diagnosis: mdd, recurrent, mild  Collaboration of Care: Patient refused AEB none requested by the client.  Patient/Guardian was advised Release of Information must be obtained prior to any record release in order to collaborate their care with an outside provider. Patient/Guardian was advised if they have not already done so to contact the registration department to sign all necessary forms in order for us  to release information regarding their care.   Consent: Patient/Guardian gives verbal consent for treatment and assignment of benefits for services provided during this visit. Patient/Guardian expressed understanding and agreed to proceed.   Hanni Milford Y Kayela Humphres, LCSW 09/26/2023

## 2023-10-04 ENCOUNTER — Other Ambulatory Visit: Payer: Self-pay | Admitting: Family Medicine

## 2023-10-04 ENCOUNTER — Telehealth (HOSPITAL_COMMUNITY): Payer: Self-pay

## 2023-10-04 DIAGNOSIS — J4520 Mild intermittent asthma, uncomplicated: Secondary | ICD-10-CM

## 2023-10-04 NOTE — Telephone Encounter (Signed)
 pharamcy faxed a request for new rx instructions. is pt to take 1 tablet by mouth 2 x daily or is pt take 1 tablet 3 x daily. they are requesting only one instruction.  Pt was last seen on 9-2 next appt 10-14

## 2023-10-05 NOTE — Telephone Encounter (Signed)
 pharmacy notified of the instructions.
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
## 2023-10-16 ENCOUNTER — Ambulatory Visit (HOSPITAL_COMMUNITY): Admitting: Clinical

## 2023-10-17 ENCOUNTER — Telehealth (HOSPITAL_COMMUNITY): Payer: Self-pay

## 2023-10-17 ENCOUNTER — Ambulatory Visit (INDEPENDENT_AMBULATORY_CARE_PROVIDER_SITE_OTHER): Admitting: Psychiatry

## 2023-10-17 VITALS — BP 126/83 | HR 85 | Wt 197.8 lb

## 2023-10-17 DIAGNOSIS — F330 Major depressive disorder, recurrent, mild: Secondary | ICD-10-CM | POA: Diagnosis not present

## 2023-10-17 DIAGNOSIS — F411 Generalized anxiety disorder: Secondary | ICD-10-CM

## 2023-10-17 DIAGNOSIS — F4310 Post-traumatic stress disorder, unspecified: Secondary | ICD-10-CM

## 2023-10-17 DIAGNOSIS — F5081 Binge eating disorder, mild: Secondary | ICD-10-CM | POA: Diagnosis not present

## 2023-10-17 MED ORDER — PROPRANOLOL HCL 10 MG PO TABS: 10.0000 mg | ORAL_TABLET | Freq: Every day | ORAL | 2 refills | Status: DC | PRN

## 2023-10-17 MED ORDER — PRAZOSIN HCL 1 MG PO CAPS: 1.0000 mg | ORAL_CAPSULE | Freq: Every day | ORAL | 2 refills | Status: DC

## 2023-10-17 MED ORDER — VENLAFAXINE HCL ER 150 MG PO CP24: 150.0000 mg | ORAL_CAPSULE | Freq: Every day | ORAL | 1 refills | Status: DC

## 2023-10-17 NOTE — Telephone Encounter (Signed)
 Encounter opened in error.

## 2023-10-19 ENCOUNTER — Other Ambulatory Visit: Payer: Self-pay | Admitting: Family Medicine

## 2023-10-19 ENCOUNTER — Ambulatory Visit (INDEPENDENT_AMBULATORY_CARE_PROVIDER_SITE_OTHER): Admitting: Family Medicine

## 2023-10-19 ENCOUNTER — Encounter: Payer: Self-pay | Admitting: Family Medicine

## 2023-10-19 VITALS — BP 102/70 | HR 84 | Temp 98.9°F | Ht 61.0 in | Wt 200.6 lb

## 2023-10-19 DIAGNOSIS — K219 Gastro-esophageal reflux disease without esophagitis: Secondary | ICD-10-CM

## 2023-10-19 DIAGNOSIS — E039 Hypothyroidism, unspecified: Secondary | ICD-10-CM | POA: Diagnosis not present

## 2023-10-19 DIAGNOSIS — Z23 Encounter for immunization: Secondary | ICD-10-CM | POA: Diagnosis not present

## 2023-10-19 DIAGNOSIS — R0982 Postnasal drip: Secondary | ICD-10-CM | POA: Diagnosis not present

## 2023-10-19 DIAGNOSIS — E038 Other specified hypothyroidism: Secondary | ICD-10-CM

## 2023-10-19 DIAGNOSIS — J3089 Other allergic rhinitis: Secondary | ICD-10-CM | POA: Diagnosis not present

## 2023-10-19 LAB — TSH: TSH: 2.58 u[IU]/mL (ref 0.35–5.50)

## 2023-10-19 MED ORDER — FAMOTIDINE 20 MG PO TABS: 20.0000 mg | ORAL_TABLET | Freq: Two times a day (BID) | ORAL | 3 refills | Status: AC

## 2023-10-19 MED ORDER — FLUTICASONE PROPIONATE 50 MCG/ACT NA SUSP: 2.0000 | Freq: Every day | NASAL | 6 refills | Status: AC

## 2023-10-19 NOTE — Progress Notes (Signed)
 "  Acute Office Visit  Subjective:     Patient ID: Tiffany [name], female    DOB: May 01, 2000, 23 y.o.   MRN: [id number]  Chief Complaint  Patient presents with   Acute Visit    Post nasal drip and cough, comes and goes, worse this year Pneumovax 23 per allergist Discuss Thyroid  results    HPI  Discussed the use of AI scribe software for clinical note transcription with the patient, who gave verbal consent to proceed.  History of Present Illness Tiffany [name] is a 23 year old female with GERD who presents with persistent cough and throat irritation. Accompanied by her mother today.  Upper airway symptoms - Persistent postnasal drip for several months - Chronic throat irritation - Ongoing cough - Nasal spray prescribed by allergy  specialist provided no symptom relief and has an unpleasant taste - Allergy  testing negative for significant reactions - Respiratory sensitivity to allergens such as cinnamon and spruce  Gastroesophageal reflux symptoms - Uses famotidine  for management of gastroesophageal reflux disease (GERD) - Prefers famotidine  due to its dual action as a histamine blocker and acid reducer  Thyroid  dysfunction - Diagnosed with hypothyroidism - Currently taking medication for thyroid  function  Migraine headaches - History of migraines since childhood - Migraines exacerbated by environmental factors, including weather changes     ROS Per HPI      Objective:    BP 102/70 (BP Location: Left Arm, Patient Position: Sitting)   Pulse 84   Temp 98.9 F (37.2 C) (Oral)   Ht 5' 1 (1.549 m)   Wt 200 lb 9.6 oz (91 kg)   SpO2 99%   BMI 37.90 kg/m    Physical Exam Vitals and nursing note reviewed.  Constitutional:      General: She is not in acute distress.    Appearance: Normal appearance.  HENT:     Head: Normocephalic and atraumatic.     Right Ear: External ear normal.     Left Ear: External ear normal.     Nose: Nose normal.     Mouth/Throat:      Mouth: Mucous membranes are moist.     Pharynx: Oropharynx is clear.  Eyes:     Extraocular Movements: Extraocular movements intact.     Pupils: Pupils are equal, round, and reactive to light.  Cardiovascular:     Rate and Rhythm: Normal rate and regular rhythm.     Pulses: Normal pulses.     Heart sounds: Normal heart sounds.  Pulmonary:     Effort: Pulmonary effort is normal. No respiratory distress.     Breath sounds: Normal breath sounds. No wheezing, rhonchi or rales.  Musculoskeletal:        General: Normal range of motion.     Cervical back: Normal range of motion.     Right lower leg: No edema.     Left lower leg: No edema.  Lymphadenopathy:     Cervical: No cervical adenopathy.  Neurological:     General: No focal deficit present.     Mental Status: She is alert and oriented to person, place, and time.  Psychiatric:        Mood and Affect: Mood normal.        Thought Content: Thought content normal.     Results for orders placed or performed in visit on 10/19/23  TSH  Result Value Ref Range   TSH 2.58 0.35 - 5.50 uIU/mL        Assessment &  Plan:   Assessment and Plan Assessment & Plan Chronic postnasal drip with cough and allergic rhinitis Chronic postnasal drip causing throat irritation and cough. Allergy  testing showed sensitivity to Timothy grass and mold. Current nasal spray has unpleasant taste. - Prescribe Flonase  (fluticasone ) nasal spray as an alternative.  GERD without esophagitis  possible GERD contributing to postnasal drip and throat irritation. Discussed Pepcid  (famotidine ) for acid reflux and inflammatory response. - Recommend Pepcid  (famotidine ).  Hypothyroidism Hypothyroidism with no significant improvement in heart rate. Discussed medication timing for proper absorption with acid reducers. - Recheck thyroid  function tests today.  Immunization due Evaluation for possible immunodeficiency Evaluation due to lack of response to pneumonia  titers. Unable to receive Pneumovax 23 due to availability with allergist office. Discussed monitoring for adverse reactions post-vaccination. - Administer Pneumovax 23 vaccine today. - Monitor for 15 minutes post-vaccination for adverse reactions.     Orders Placed This Encounter  Procedures   Pneumococcal polysaccharide vaccine 23-valent greater than or equal to 2yo subcutaneous/IM   TSH     Meds ordered this encounter  Medications   fluticasone  (FLONASE ) 50 MCG/ACT nasal spray    Sig: Place 2 sprays into both nostrils daily.    Dispense:  16 g    Refill:  6   famotidine  (PEPCID ) 20 MG tablet    Sig: Take 1 tablet (20 mg total) by mouth 2 (two) times daily.    Dispense:  60 tablet    Refill:  3    Return if symptoms worsen or fail to improve.  Corean LITTIE Ku, FNP  "

## 2023-10-19 NOTE — Patient Instructions (Signed)
 We are checking labs today, will be in contact with any results that require further attention  We have given your PCV 23 vaccine.   Follow-up with me for new or worsening symptoms.

## 2023-10-20 ENCOUNTER — Ambulatory Visit: Payer: Self-pay | Admitting: Family Medicine

## 2023-11-20 ENCOUNTER — Ambulatory Visit (HOSPITAL_COMMUNITY): Admitting: Clinical

## 2023-11-20 DIAGNOSIS — F411 Generalized anxiety disorder: Secondary | ICD-10-CM

## 2023-11-20 NOTE — Progress Notes (Signed)
   THERAPIST PROGRESS NOTE  Session Time: 50 min  Participation Level: Active  Behavioral Response: CasualAlertDepressed  Type of Therapy: Individual Therapy  Treatment Goals addressed: client will engage in at least 80% of scheduled individual psychotherapy sessions   ProgressTowards Goals: Progressing  Interventions: CBT and Supportive  Summary:  Tiffany [name] is a 23 y.o. female who presents for the scheduled appointment oriented x 5, appropriately dressed, and friendly.  Client denied hallucinations and delusions. Client reported on today she has been fairly okay but has been experiencing some depression for over a month.  Client reported she cannot recall if anything triggered the episode but when she met with the psychiatrist her medication was increased.  Client reported feelings of lack of interest or pleasure in engaging in normal activities that she would like to do such as playing online games with her friends, tending to her plants, or decorating for the holidays.  Client reported she catches herself in waves having the motivation to do a few things but not going to the full extent that she normally would.  Client reported even her grandfather has noticed.  Client reported she is excited that her boyfriend will be coming from [place]  to stay with his father in [place]  from a few days before Christmas into the new year.  Client reported she will go to stay with him.  Client reported she has also beentending to her medical appointments but she is still waiting to get a referral to the cardiologist about the palpitations and lightheadedness that she experiences.  Client reported she is a bit anxious about not spending the Christmas holiday with her family.  Client reported she will do the first holiday when she is completely away from them.  Client reported her family feels dysfunctional mainly because of her grandmothers unexplainable irritability and argumentative behaviors  during the holidays that make you feel unbearable. Evidence of progress towards goal: Client reported 1 positive of not allowing herself to stay in the house or isolating to her room past 3 days.  Suicidal/Homicidal: Nowithout intent/plan  Therapist Response:  Therapist began the appointment asking the client how she has been doing since last seen. Therapist engaged with active listening and positive emotional support. Therapist used CBT to ask client about changes out of her current. Therapist used CBT to ask clarifying questions about the potential origin of her depressive episode and what her daily activities look like over the past month. Therapist used CBT to teach her about coping skills and routines to help with managing depressive episodes. Therapist used CBT ask the client to identify her progress with frequency of use with coping skills with continued practice in her daily activity.    Therapist assigned client homework to practice the coping skills discussed.   Plan: Return again in 4 weeks.  Diagnosis: GAD  Collaboration of Care: Patient refused AEB none requested by the client.  Patient/Guardian was advised Release of Information must be obtained prior to any record release in order to collaborate their care with an outside provider. Patient/Guardian was advised if they have not already done so to contact the registration department to sign all necessary forms in order for us  to release information regarding their care.   Consent: Patient/Guardian gives verbal consent for treatment and assignment of benefits for services provided during this visit. Patient/Guardian expressed understanding and agreed to proceed.   Sola Margolis Y Jermiah Soderman, LCSW 11/20/2023
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
## 2023-12-07 ENCOUNTER — Ambulatory Visit (INDEPENDENT_AMBULATORY_CARE_PROVIDER_SITE_OTHER): Admitting: Psychiatry

## 2023-12-07 DIAGNOSIS — E559 Vitamin D deficiency, unspecified: Secondary | ICD-10-CM | POA: Diagnosis not present

## 2023-12-07 DIAGNOSIS — F411 Generalized anxiety disorder: Secondary | ICD-10-CM | POA: Diagnosis not present

## 2023-12-07 DIAGNOSIS — F4310 Post-traumatic stress disorder, unspecified: Secondary | ICD-10-CM | POA: Diagnosis not present

## 2023-12-07 DIAGNOSIS — F330 Major depressive disorder, recurrent, mild: Secondary | ICD-10-CM

## 2023-12-07 MED ORDER — PROPRANOLOL HCL 10 MG PO TABS: 10.0000 mg | ORAL_TABLET | Freq: Every day | ORAL | 0 refills | Status: AC | PRN

## 2023-12-07 MED ORDER — PRAZOSIN HCL 1 MG PO CAPS: 1.0000 mg | ORAL_CAPSULE | Freq: Every day | ORAL | 0 refills | Status: AC

## 2023-12-07 MED ORDER — VENLAFAXINE HCL ER 150 MG PO CP24: 150.0000 mg | ORAL_CAPSULE | Freq: Every day | ORAL | 0 refills | Status: DC

## 2023-12-11 ENCOUNTER — Other Ambulatory Visit (INDEPENDENT_AMBULATORY_CARE_PROVIDER_SITE_OTHER)

## 2023-12-11 DIAGNOSIS — F200 Paranoid schizophrenia: Secondary | ICD-10-CM

## 2023-12-11 DIAGNOSIS — E559 Vitamin D deficiency, unspecified: Secondary | ICD-10-CM

## 2023-12-11 NOTE — Progress Notes (Signed)
 Patient presented to the office for labs, labs were drawn from right hand with no issue or complaints . Pt left office alert and ambulatory.

## 2023-12-12 ENCOUNTER — Other Ambulatory Visit (HOSPITAL_COMMUNITY): Payer: Self-pay | Admitting: Psychiatry

## 2023-12-13 ENCOUNTER — Encounter: Payer: Self-pay | Admitting: Allergy

## 2023-12-13 ENCOUNTER — Other Ambulatory Visit: Payer: Self-pay

## 2023-12-13 ENCOUNTER — Ambulatory Visit (INDEPENDENT_AMBULATORY_CARE_PROVIDER_SITE_OTHER): Admitting: Allergy

## 2023-12-13 VITALS — BP 114/90 | HR 76 | Temp 97.3°F | Resp 16 | Ht 62.13 in | Wt 191.9 lb

## 2023-12-13 DIAGNOSIS — J4520 Mild intermittent asthma, uncomplicated: Secondary | ICD-10-CM

## 2023-12-13 DIAGNOSIS — B999 Unspecified infectious disease: Secondary | ICD-10-CM

## 2023-12-13 DIAGNOSIS — J3089 Other allergic rhinitis: Secondary | ICD-10-CM

## 2023-12-13 DIAGNOSIS — K9049 Malabsorption due to intolerance, not elsewhere classified: Secondary | ICD-10-CM

## 2023-12-13 DIAGNOSIS — E739 Lactose intolerance, unspecified: Secondary | ICD-10-CM | POA: Diagnosis not present

## 2023-12-13 DIAGNOSIS — Z7722 Contact with and (suspected) exposure to environmental tobacco smoke (acute) (chronic): Secondary | ICD-10-CM

## 2023-12-13 DIAGNOSIS — K219 Gastro-esophageal reflux disease without esophagitis: Secondary | ICD-10-CM

## 2023-12-13 LAB — VITAMIN D 25 HYDROXY (VIT D DEFICIENCY, FRACTURES): Vit D, 25-Hydroxy: 25 ng/mL — ABNORMAL LOW (ref 30.0–100.0)

## 2023-12-13 LAB — SPECIMEN STATUS REPORT

## 2023-12-13 NOTE — Patient Instructions (Addendum)
 Environmental allergies 2025 skin testing borderline to grass and one mold. 2025 labs all negative.  Continue environmental control measures as below. May use over the counter antihistamines such as Zyrtec  (cetirizine ), Claritin (loratadine), Allegra (fexofenadine), or Xyzal (levocetirizine) daily as needed. May take twice a day during allergy  flares. May switch antihistamines every few months. Use Flonase  (fluticasone ) nasal spray 1-2 sprays per nostril once a day as needed for nasal congestion.  Nasal saline spray (i.e., Simply Saline) or nasal saline lavage (i.e., NeilMed) is recommended as needed and prior to medicated nasal sprays.  Foods  Continue to avoid known triggers - hot sauce, tomatoes.   Lactose intolerance May use lactose free milk or take a lactaid pill right before consuming anything with dairy.  Breathing Avoid known triggers - cinnamon, live christmas trees.  May use albuterol  rescue inhaler 2 puffs every 4 to 6 hours as needed for shortness of breath, chest tightness, coughing, and wheezing. May use albuterol  rescue inhaler 2 puffs 5 to 15 minutes prior to strenuous physical activities. Monitor frequency of use - if you need to use it more than twice per week on a consistent basis let us  know.  Breathing control goals:  Full participation in all desired activities (may need albuterol  before activity) Albuterol  use two times or less a week on average (not counting use with activity) Cough interfering with sleep two times or less a month Oral steroids no more than once a year No hospitalizations   Reflux Continue lifestyle and dietary modifications. Continue famotidine  20mg  twice a day.   Infections Keep track of infections and antibiotics use. Get bloodwork to check vaccine response.   Return in about 6 months (around 06/12/2024). Or sooner if needed.   Reducing Pollen Exposure Pollen seasons: trees (spring), grass (summer) and ragweed/weeds (fall). Keep windows  closed in your home and car to lower pollen exposure.  Install air conditioning in the bedroom and throughout the house if possible.  Avoid going out in dry windy days - especially early morning. Pollen counts are highest between 5 - 10 AM and on dry, hot and windy days.  Save outside activities for late afternoon or after a heavy rain, when pollen levels are lower.  Avoid mowing of grass if you have grass pollen allergy . Be aware that pollen can also be transported indoors on people and pets.  Dry your clothes in an automatic dryer rather than hanging them outside where they might collect pollen.  Rinse hair and eyes before bedtime.  Mold Control Mold and fungi can grow on a variety of surfaces provided certain temperature and moisture conditions exist.  Outdoor molds grow on plants, decaying vegetation and soil. The major outdoor mold, Alternaria and Cladosporium, are found in very high numbers during hot and dry conditions. Generally, a late summer - fall peak is seen for common outdoor fungal spores. Rain will temporarily lower outdoor mold spore count, but counts rise rapidly when the rainy period ends. The most important indoor molds are Aspergillus and Penicillium. Dark, humid and poorly ventilated basements are ideal sites for mold growth. The next most common sites of mold growth are the bathroom and the kitchen. Outdoor (Seasonal) Mold Control Use air conditioning and keep windows closed. Avoid exposure to decaying vegetation. Avoid leaf raking. Avoid grain handling. Consider wearing a face mask if working in moldy areas.  Indoor (Perennial) Mold Control  Maintain humidity below 50%. Get rid of mold growth on hard surfaces with water, detergent and, if necessary, 5%  bleach (do not mix with other cleaners). Then dry the area completely. If mold covers an area more than 10 square feet, consider hiring an indoor environmental professional. For clothing, washing with soap and water is  best. If moldy items cannot be cleaned and dried, throw them away. Remove sources e.g. contaminated carpets. Repair and seal leaking roofs or pipes. Using dehumidifiers in damp basements may be helpful, but empty the water and clean units regularly to prevent mildew from forming. All rooms, especially basements, bathrooms and kitchens, require ventilation and cleaning to deter mold and mildew growth. Avoid carpeting on concrete or damp floors, and storing items in damp areas.

## 2023-12-13 NOTE — Progress Notes (Signed)
 Follow Up Note  RE: Tiffany [name] MRN: [id number] DOB: 29-Apr-2000 Date of Office Visit: 12/13/2023  Referring provider: Alvia Corean CROME, * Primary care provider: Alvia Corean CROME, FNP  Chief Complaint: Follow-up (She would like to ask the blood works because she got pneumonia shot in October 16. ) and Allergic Rhinitis  (Is doing fine )  History of Present Illness: I had the pleasure of seeing Harvey Lingo for a follow up visit at the [facility] on 12/13/2023. She is a 23 y.o. female, who is being followed for allergic rhinitis, asthma, rash, recurrent infections, GERD, adverse food reactions. Her previous allergy  office visit was on 09/15/2023 with Dr. Luke. Today is a regular follow up visit. She is accompanied today by her mother who provided/contributed to the history.   Discussed the use of AI scribe software for clinical note transcription with the patient, who gave verbal consent to proceed.    She received her pneumonia shot on October 19, 2023, and is here for blood work to assess her response to the vaccine.   She uses her albuterol  inhaler infrequently, approximately once a month. She avoids known triggers such as live Christmas trees. She has not needed to go to the emergency room or take prednisone  recently.  She is currently taking famotidine  twice daily for heartburn or reflux, as her previous medication was not covered by insurance. Famotidine  seems to be as effective as omeprazole . She no longer takes omeprazole .  She takes her thyroid  medication first thing in the morning and waits half an hour to an hour before taking other medications. She had previously stopped taking her thyroid  medication due to frustration with the eating restrictions but has resumed it.  She takes Flonase  once daily for allergies, which she finds helpful. She no longer takes azelastine  due to its unpleasant taste. No current drainage or congestion and no recent  rashes.  She does not smoke, but her grandparent smoke inside the house. She tries to stay in her room to avoid smoke exposure.      2025 labs: Your blood count, immunoglobulin G levels were normal which is great. You also have good protection against diptheria and tetanus.   However, your pneumococcal titers were low. Sometimes people with low titers are more likely to develop respiratory infections caused by the bacteria strep pneumoniae. I would like for you to get the pneumovax 23 vaccine (also known as the pneumonia shot) as it can boost the levels and offer protection against this bacteria in the future. Once you get the vaccine, we check the levels 4 weeks afterwards to make sure your immune system responded to the vaccine appropriately. You can get the pneumovax vaccine at your PCP's office or pharmacy. If they don't offer it there, let us  know and in certain cases we have given them in our office.    Make sure it's the pneumovax 23 vaccine and NOT the prevnar.    Tomato and cinnamon was negative.  Environmental panel was negative.   Keep follow up in December.   Assessment and Plan: Kenlee is a 23 y.o. female with: Other allergic rhinitis Past history - reports itching and respiratory symptoms with exposure to allergens. Dog saliva causes skin reactions. Previous medications were beneficial. 2025 skin prick testing borderline to grass and one mold. 2025 labs all negative.  Interim history - didn't like azelastine . Flonase  works. Get bloodwork to double check results due to your sensitive skin.  Continue environmental  control measures as below. May use over the counter antihistamines such as Zyrtec  (cetirizine ), Claritin (loratadine), Allegra (fexofenadine), or Xyzal (levocetirizine) daily as needed. May take twice a day during allergy  flares. May switch antihistamines every few months. Use Flonase  (fluticasone ) nasal spray 1-2 sprays per nostril once a day as needed for nasal  congestion.  Nasal saline spray (i.e., Simply Saline) or nasal saline lavage (i.e., NeilMed) is recommended as needed and prior to medicated nasal sprays.   Food intolerance Past history - Facial redness from hot sauce likely due to vasodilation. Mucus after tomatoes possibly related to GERD. Lactaid alleviates lactose intolerance symptoms. 2025 skin prick testing and labs negative to cinnamon and tomato. Continue to avoid known triggers - hot sauce, tomatoes.  May use lactose free milk or take a lactaid pill right before consuming anything with dairy.   Mild intermittent asthma without complication Second hand tobacco smoke exposure Past history - Asthma triggered by environmental factors and activity. Rare Ventolin  use indicates well-controlled asthma. 2025 spirometry was not interpretable due to poor effort. Interim history - only needed to use albuterol  once a month. Grandparents smoke indoors. Today's spirometry was unremarkable given effort.  Avoid known triggers - cinnamon, live christmas trees.  May use albuterol  rescue inhaler 2 puffs every 4 to 6 hours as needed for shortness of breath, chest tightness, coughing, and wheezing. May use albuterol  rescue inhaler 2 puffs 5 to 15 minutes prior to strenuous physical activities. Monitor frequency of use - if you need to use it more than twice per week on a consistent basis let us  know.    Recurrent infections Past history - frequent ear infections even as an adult. Had tubes as a child. Lives with smokers inside the home. 2025 lab normal immunoglobulin levels, poor pneumococcal titers. Interim history - no infections, got pneumonia shot. Keep track of infections and antibiotics use. Get bloodwork to check vaccine response.    Gastroesophageal reflux disease, unspecified whether esophagitis present Past history - Symptoms suggestive of GERD. Spicy foods and tomatoes may worsen reflux. Interim history - taking famotidine  now and it's just  as effective as PPI for her.  Continue lifestyle and dietary modifications. Continue famotidine  20mg  twice a day.   Return in about 6 months (around 06/12/2024).  No orders of the defined types were placed in this encounter.  Lab Orders         Strep pneumoniae 23 Serotypes IgG      Diagnostics: Spirometry:  Tracings reviewed. Her effort: It was hard to get consistent efforts and there is a question as to whether this reflects a maximal maneuver. FVC: 4.06L FEV1: 3.23L, 109% predicted FEV1/FVC ratio: 80% Interpretation: No overt abnormalities noted given today's efforts.  Please see scanned spirometry results for details.  Results discussed with patient/family.   Medication List:  Current Outpatient Medications  Medication Sig Dispense Refill   famotidine  (PEPCID ) 20 MG tablet Take 1 tablet (20 mg total) by mouth 2 (two) times daily. 60 tablet 3   fluticasone  (FLONASE ) 50 MCG/ACT nasal spray Place 2 sprays into both nostrils daily. 16 g 6   levothyroxine  (SYNTHROID ) 25 MCG tablet TAKE 1 TABLET(25 MCG) BY MOUTH DAILY 30 tablet 2   prazosin  (MINIPRESS ) 1 MG capsule Take 1 capsule (1 mg total) by mouth at bedtime. 90 capsule 0   propranolol  (INDERAL ) 10 MG tablet Take 1 tablet (10 mg total) by mouth daily as needed (anxiety). 90 tablet 0   venlafaxine  XR (EFFEXOR  XR) 150 MG 24  hr capsule Take 1 capsule (150 mg total) by mouth daily with breakfast. 60 capsule 0   VENTOLIN  HFA 108 (90 Base) MCG/ACT inhaler INHALE 1 TO 2 PUFFS INTO THE LUNGS EVERY 6 HOURS AS NEEDED FOR WHEEZING OR SHORTNESS OF BREATH 18 g 0   Vitamin D , Ergocalciferol , (DRISDOL ) 1.25 MG (50000 UNIT) CAPS capsule Take 1 capsule (50,000 Units total) by mouth every 7 (seven) days. 8 capsule 0   No current facility-administered medications for this visit.   Allergies: Allergies  Allergen Reactions   Latex    I reviewed her past medical history, social history, family history, and environmental history and no  significant changes have been reported from her previous visit.  Review of Systems  Constitutional:  Negative for appetite change, chills, fever and unexpected weight change.  HENT:  Negative for congestion, postnasal drip and rhinorrhea.   Eyes:  Negative for itching.  Respiratory:  Negative for cough, chest tightness, shortness of breath and wheezing.   Cardiovascular:  Negative for chest pain.  Gastrointestinal:  Negative for abdominal pain.  Genitourinary:  Negative for difficulty urinating.  Skin:  Negative for rash.  Neurological:  Negative for headaches.    Objective: BP (!) 114/90 (BP Location: Right Arm, Patient Position: Sitting, Cuff Size: Normal)   Pulse 76   Temp (!) 97.3 F (36.3 C) (Temporal)   Resp 16   Ht 5' 2.13 (1.578 m)   Wt 191 lb 14.4 oz (87 kg)   SpO2 98%   BMI 34.96 kg/m  Body mass index is 34.96 kg/m. Physical Exam Vitals and nursing note reviewed.  Constitutional:      Appearance: Normal appearance. She is well-developed.  HENT:     Head: Normocephalic and atraumatic.     Right Ear: External ear normal.     Left Ear: External ear normal.     Ears:     Comments: Scarring of TM b/l    Nose: Nose normal.     Mouth/Throat:     Mouth: Mucous membranes are moist.     Pharynx: Oropharynx is clear.  Eyes:     Conjunctiva/sclera: Conjunctivae normal.  Cardiovascular:     Rate and Rhythm: Normal rate and regular rhythm.     Heart sounds: Normal heart sounds. No murmur heard.    No friction rub. No gallop.  Pulmonary:     Effort: Pulmonary effort is normal.     Breath sounds: Normal breath sounds. No wheezing, rhonchi or rales.  Musculoskeletal:     Cervical back: Neck supple.  Skin:    General: Skin is warm.     Findings: No rash.  Neurological:     Mental Status: She is alert and oriented to person, place, and time.  Psychiatric:        Behavior: Behavior normal.    Previous notes and tests were reviewed. The plan was reviewed with the  patient/family, and all questions/concerned were addressed.  It was my pleasure to see Sharonda today and participate in her care. Please feel free to contact me with any questions or concerns.  Sincerely,  Orlan Cramp, DO Allergy  & Immunology  [facility]  [place] office: [phone] [facility] office: [phone]

## 2023-12-14 ENCOUNTER — Ambulatory Visit (HOSPITAL_COMMUNITY): Payer: Self-pay | Admitting: Psychiatry

## 2023-12-17 ENCOUNTER — Ambulatory Visit: Payer: Self-pay | Admitting: Allergy

## 2023-12-17 LAB — STREP PNEUMONIAE 23 SEROTYPES IGG
Pneumo Ab Type 1*: 7.4 ug/mL (ref >1.3)
Pneumo Ab Type 12 (12F)*: 7.2 ug/mL (ref >1.3)
Pneumo Ab Type 14*: 2.9 ug/mL (ref >1.3)
Pneumo Ab Type 17 (17F)*: 18.8 ug/mL (ref >1.3)
Pneumo Ab Type 19 (19F)*: 10 ug/mL (ref >1.3)
Pneumo Ab Type 2*: 12.7 ug/mL (ref >1.3)
Pneumo Ab Type 20*: 10.5 ug/mL (ref >1.3)
Pneumo Ab Type 22 (22F)*: 25.2 ug/mL (ref >1.3)
Pneumo Ab Type 23 (23F)*: >31.2 ug/mL (ref >1.3)
Pneumo Ab Type 26 (6B)*: 14.4 ug/mL (ref >1.3)
Pneumo Ab Type 3*: 0.6 ug/mL — AB (ref >1.3)
Pneumo Ab Type 34 (10A)*: 19.5 ug/mL (ref >1.3)
Pneumo Ab Type 4*: 3 ug/mL (ref >1.3)
Pneumo Ab Type 43 (11A)*: >11.6 ug/mL (ref >1.3)
Pneumo Ab Type 5*: >40.1 ug/mL (ref >1.3)
Pneumo Ab Type 51 (7F)*: 3.9 ug/mL (ref >1.3)
Pneumo Ab Type 54 (15B)*: 3.9 ug/mL (ref >1.3)
Pneumo Ab Type 56 (18C)*: 4.8 ug/mL (ref >1.3)
Pneumo Ab Type 57 (19A)*: 3.1 ug/mL (ref >1.3)
Pneumo Ab Type 68 (9V)*: >16.2 ug/mL (ref >1.3)
Pneumo Ab Type 70 (33F)*: >20.2 ug/mL (ref >1.3)
Pneumo Ab Type 8*: 38.4 ug/mL (ref >1.3)
Pneumo Ab Type 9 (9N)*: 13.9 ug/mL (ref >1.3)

## 2023-12-19 ENCOUNTER — Ambulatory Visit (INDEPENDENT_AMBULATORY_CARE_PROVIDER_SITE_OTHER): Admitting: Clinical

## 2023-12-19 DIAGNOSIS — F330 Major depressive disorder, recurrent, mild: Secondary | ICD-10-CM

## 2023-12-20 NOTE — Progress Notes (Signed)
" ° °  THERAPIST PROGRESS NOTE  Session Time: 45 min  Participation Level: Active  Behavioral Response: CasualAlertEuthymic  Type of Therapy: Individual Therapy  Treatment Goals addressed: client will attend at least 80% of scheduled individual psychotherapy sessions  ProgressTowards Goals: Progressing  Interventions: CBT  Summary:  Tiffany [name] is a 23 y.o. female who presents for the scheduled appointment oriented x 5, appropriately dressed, and friendly.  Client denied hallucinations and delusions. Client reported today she is doing pretty good.  Client reported she is excited but also nervous about her boyfriend coming into town. Client reported she has things plan for them to get together. Client reported she will be meeting his dad side of the family at Christmas and is nervous. Client reported this to be her first Christmas that she does not spend with her family. Client reported she was surprised that Thanksgiving this year no arguments broke out so she is hoping that while she is away that will be the same for Christmas.  Client reported she decorated the Christmas tree and put her on special ornaments client that she really likes. Client reported she has still been waiting for confirmation from her PCP which is in [facility] that they have been able to make a referral to cardiology.  Client reported for some reason her doctor has not been able to get in contact with the cardiology office.  Client reported a referral was made to make clear diagnosis if she has POTS or not. Client reported she doesn't want anything to be wrong but needs to get checked. Evidence of progress towards goal:  client reported 1 positive of looking forward to planned activities.   Suicidal/Homicidal: Nowithout intent/plan  Therapist Response:  Therapist began the appointment asking the client how she has been doing. Therapist engaged with active listening and positive emotional support. Therapist used cbt  to engage and ask how she is feeling about the holidays. Therapist used cbt to encourage she advocate for herself regarding healthcare appointments. Therapist used cbt to encourage her to enjoy her activities. Therapist used CBT ask the client to identify her progress with frequency of use with coping skills with continued practice in her daily activity.    Therapist assigned him homework to practice self care.   Plan: Return again in 4 weeks.  Diagnosis: mdd, recurrent episode, mild  Collaboration of Care: Patient refused AEB none requested by the client.  Patient/Guardian was advised Release of Information must be obtained prior to any record release in order to collaborate their care with an outside provider. Patient/Guardian was advised if they have not already done so to contact the registration department to sign all necessary forms in order for us  to release information regarding their care.   Consent: Patient/Guardian gives verbal consent for treatment and assignment of benefits for services provided during this visit. Patient/Guardian expressed understanding and agreed to proceed.   Mistina Coatney Y Anaissa Macfadden, LCSW 12/19/2023  "

## 2024-01-10 ENCOUNTER — Encounter: Payer: Self-pay | Admitting: Family Medicine

## 2024-01-13 NOTE — Assessment & Plan Note (Signed)
 Episodes of rapid heart rate, likely benign ectopic beats or SVT. Resting EKG normal, suggesting intermittent arrhythmia. Potential for self-resolution. - Ordered heart monitor to assess for arrhythmias. - Instructed on vagal maneuvers such as vigorous coughing, blowing through a straw, and drinking ice water to terminate episodes. - Discussed potential use of Inderal  10 mg as needed for prolonged episodes, noting it takes > 1 hour to take effect. - Advised against routine medication use due to young age and potential for self-resolution, beyond PRN Pranau 10 mg which she has been using intermittently.

## 2024-01-13 NOTE — Progress Notes (Unsigned)
 " Cardiology Office Note:  .   Date:  01/18/2024  ID:  Tiffany [name], DOB Sep 09, 2000, MRN [id number] PCP: Alvia Corean CROME, FNP  [facility] Providers Cardiologist:  Alm Clay, MD     Chief Complaint  Patient presents with   New Patient (Initial Visit)    Paroxysmal tachycardia.,  Orthostasis and syncope-concern for POTS   Palpitations    Patient Profile: .     Tiffany [name] is a moderately obese 24 y.o. female with a PMH notable for GAD/MDD (with PTSD), ?  Paroxysmal Tachycardia (with presumed diagnosis of POTS), asthma as well as hypothyroidism and migraines who presents here for evaluation of fast heart rates and dizziness concern for POTS at the request of Alvia Corean CROME, FNP.  She was seen by Corean Alvia on July 28, 2023 with complaints of tachycardia with heart rates up to 130 beats a minute that occurs sporadically usually during early morning.  Was using propranolol  as needed for worsening spells.  Was told to increase salt last electrolyte intake (was noted to have mild hyponatremia).  They discussed referral to cardiology.    She recently called in to PCPs office indicating that there was an episode that scared her.  She was getting up putting her clothes on and started sweating.  Heart rate was up to 140s.SABRA  Referral placed for possible POTS.  Subjective  Discussed the use of AI scribe software for clinical note transcription with the patient, who gave verbal consent to proceed.  She is companied by her mother.  History of Present Illness Tiffany [name] is a 24 year old female who presents with episodes of tachycardia and dizziness. She is accompanied by her mother. She was referred by a previous doctor for evaluation of possible POTS.  She experiences episodes of tachycardia with heart rates ranging from 125-148 bpm, lasting a few minutes. These episodes occur both at rest and during activity, with longer durations when she is in bed. She  describes a sensation of her heart 'squeezing in' with associated pressure and pain during these episodes.  During these episodes, she experiences dizziness and a sensation of her ears being 'foggy' or 'muffled', but has not experienced syncope. She feels dizzy upon standing and sometimes feels like she might pass out. Her heart rate increases upon standing, and she feels dizzy and lightheaded after prolonged standing.  She consumes mostly water, about three to four 16-20 ounce bottles a day, and rarely consumes caffeine. She sometimes experiences hot flashes when her heart rate increases significantly, such as when it reached 150 bpm after getting out of bed.  She experiences nocturnal episodes of tachycardia about twice a week, not associated with dyspnea. She uses two pillows to sleep. She has a history of being advised to wear a heart monitor during high school, which she declined due to social concerns.  Her current medications include Inderal  as needed and a thyroid  medication. Her heart rate increases to the hundreds daily, even during sleep, and she has become accustomed to it unless it becomes very high. Proximal Her mother mentions that a previous doctor suggested she might have POTS due to her symptoms and hyperextension. She has tried increasing salt intake, which helped slightly, and has used Liquid IV for hydration.  She is trying to get healthy and lose weight but experiences dizziness with standing exercises. No chest pain, pressure, or tightness outside of the episodes. She does not wake up due to shortness of breath.  Objective   Medications include: Effexor  75 mg daily PRN propranolol  10 mg up to 3 times a day as needed for anxiety and tachycardia PRN Hydroxyzine  25 mg for anxiety Prazosin  1 mg nightly (for nightmares) As needed albuterol  inhaler  Past Medical History:  Diagnosis Date   ADHD (attention deficit hyperactivity disorder) 01/03/2002   Took Adderall for  several years, then Dexedrine, now Strattera   Adjustment disorder with mixed anxiety and depressed mood 04/14/2014   Asthma since infancy   triggers more in winter, has had bronchitis/pneumonia   Dyslexia    MDD (major depressive disorder), recurrent episode, severe (HCC) 04/17/2022   MDD (major depressive disorder), recurrent, in partial remission 07/01/2022   Mild depression 07/12/2021   Mood disorder 01/04/2011   PSH: History of tonsillectomy adenoidectomy and tympanostomy tube placement.  History of heart disease in father, maternal grandmother heart murmur in maternal grandfather.  Studies Reviewed: SABRA   EKG Interpretation Date/Time:  Monday January 15 2024 11:20:36 EST Ventricular Rate:  77 PR Interval:  134 QRS Duration:  78 QT Interval:  356 QTC Calculation: 402 R Axis:   60  Text Interpretation: Normal sinus rhythm Normal ECG When compared with ECG of 17-Apr-2022 01:08, Criteria for Septal infarct are no longer Present Confirmed by Anner Lenis ([id number]) on 01/15/2024 12:32:30 PM    Lab Results  Component Value Date   NA 137 07/28/2023   K 3.9 07/28/2023   CREATININE 0.72 07/28/2023   GFR 116.63 07/28/2023   GLUCOSE 94 07/28/2023   No results found for: CHOL, HDL, LDLCALC, LDLDIRECT, TRIG, CHOLHDL Lab Results  Component Value Date   WBC 6.1 09/15/2023   HGB 13.1 09/15/2023   HCT 39.3 09/15/2023   MCV 93 09/15/2023   PLT 216 09/15/2023   Lab Results  Component Value Date   TSH 2.58 10/19/2023    Risk Assessment/Calculations:          Physical Exam:   VS:  BP 110/70 (BP Location: Left Arm, Patient Position: Sitting, Cuff Size: Large)   Pulse 78   Ht 5' 2 (1.575 m)   Wt 198 lb (89.8 kg)   SpO2 97%   BMI 36.21 kg/m    Wt Readings from Last 3 Encounters:  01/15/24 198 lb (89.8 kg)  12/13/23 191 lb 14.4 oz (87 kg)  10/19/23 200 lb 9.6 oz (91 kg)    GEN: Well nourished, well groomed; in no acute distress; moderately obese. NECK: No  JVD; No carotid bruits CARDIAC: Normal S1, S2; RRR, no murmurs, rubs, gallops RESPIRATORY:  Clear to auscultation without rales, wheezing or rhonchi ; nonlabored, good air movement. ABDOMEN: Soft, non-tender, non-distended EXTREMITIES:  No edema; No deformity      ASSESSMENT AND PLAN: .   Paroxysmal tachycardia, unspecified (HCC) Episodes of rapid heart rate, likely benign ectopic beats or SVT. Resting EKG normal, suggesting intermittent arrhythmia. Potential for self-resolution. - Ordered heart monitor to assess for arrhythmias. - Instructed on vagal maneuvers such as vigorous coughing, blowing through a straw, and drinking ice water to terminate episodes. - Discussed potential use of Inderal  10 mg as needed for prolonged episodes, noting it takes > 1 hour to take effect. - Advised against routine medication use due to young age and potential for self-resolution, beyond PRN Pranau 10 mg which she has been using intermittently.   POTS (postural orthostatic tachycardia syndrome) Symptoms consistent with POTS, likely due to autonomic nervous system dysregulation. Differential includes SVT, but symptoms align more with POTS. - Encouraged  hydration with water and electrolyte drinks like Gatorade Light or Liquid IV. - Advised sitting with feet elevated to improve venous return. - Recommended core and supine exercises to strengthen muscles and improve blood circulation. - Provided links to resources on POTS management and exercises. - Discussed potential use of support socks and abdominal binders.   Postural Orthostatic/Tachycardia Syndrome: I Spent >20 minutes counseling specifically on the etiology, diagnosis, and symptom management of POTS. The following recommendations were emphasized:  -avoid dehydration. Often it requires high volumes of fluids, often with salt/electrolytes included, to stay hydrated. People with POTS are very sensitive to fluid shifts and dehydration. Oral rehydration is  preferred, and routine use of IV fluids is not recommended.  -if tolerated, compression stocking can assist with fluid management and prevent pooling in the legs.  -slow position changes are recommended  -if there is a feeling of severe lightheadedness, like near to passing out, recommend lying on the floor on the back, with legs elevated up on a chair or up against the wall.  -the best long term management of POTS symptoms is gradual exercise conditioning. I recommend seated exercises such as bike to start, to avoid the risk of falling with lightheadedness. Exercise programs, either through supervised programs like cardiac rehab or through personal programs, should focus on gradually increasing exercise tolerance and conditioning.   -this is a link to specific exercise recommendations for POTS:   Http://peterson-powell.net/  -we discussed the typical spectrum of dysautonomia, including typical populations, that this sometimes spontaneously improves with age (though a small percentage have persistent symptoms), that this has uncomfortable symptoms but is not associated with long term mortality, and that the etiology/treatment of this is an area of active research   GAD (generalized anxiety disorder) Certainly, combination of PTSD and GAD can exacerbate tachycardia episodes.  Discussed the potential exacerbation of symptoms with prazosin  and decreasing her blood pressure, and Effexor . She has PRN hydroxyzine  as well as propranolol .   Orders Placed This Encounter  Procedures   LONG TERM MONITOR (3-14 DAYS)   EKG 12-Lead         Follow-Up: Return in about 4 months (around 05/14/2024) for 3-4 month follow-up, To discuss test results.      Signed, Alm MICAEL Clay, MD, MS Alm Clay, M.D., M.S. Interventional Cardiologist  [facility] Pager # [phone]      "

## 2024-01-15 ENCOUNTER — Ambulatory Visit: Attending: Cardiology

## 2024-01-15 ENCOUNTER — Ambulatory Visit: Attending: Cardiology | Admitting: Cardiology

## 2024-01-15 VITALS — BP 110/70 | HR 78 | Ht 62.0 in | Wt 198.0 lb

## 2024-01-15 DIAGNOSIS — G90A Postural orthostatic tachycardia syndrome (POTS): Secondary | ICD-10-CM

## 2024-01-15 DIAGNOSIS — I479 Paroxysmal tachycardia, unspecified: Secondary | ICD-10-CM

## 2024-01-15 DIAGNOSIS — F411 Generalized anxiety disorder: Secondary | ICD-10-CM | POA: Diagnosis not present

## 2024-01-15 NOTE — Progress Notes (Unsigned)
 Enrolled for [employer] to mail a ZIO XT long term holter monitor to the patients address on file.

## 2024-01-15 NOTE — Patient Instructions (Signed)
 Medication Instructions:   No changes  *If you need a refill on your cardiac medications before your next appointment, please call your pharmacy*   Lab Work: Not needed    Testing/Procedures: Your physician has recommended that you wear an event monitor 7 DAY  Zio. Event monitors are medical devices that record the hearts electrical activity. Doctors most often us  these monitors to diagnose arrhythmias. Arrhythmias are problems with the speed or rhythm of the heartbeat. The monitor is a small, portable device. You can wear one while you do your normal daily activities. This is usually used to diagnose what is causing palpitations/syncope (passing out).     Follow-Up: At [facility], you and your health needs are our priority.  As part of our continuing mission to provide you with exceptional heart care, we have created designated Provider Care Teams.  These Care Teams include your primary Cardiologist (physician) and Advanced Practice Providers (APPs -  Physician Assistants and Nurse Practitioners) who all work together to provide you with the care you need, when you need it.     Your next appointment:   4 month(s)  The format for your next appointment:   In Person  Provider:   Alm Clay, MD   Other Instructions  Recommendations for vagal maneuvers: Bearing down Coughing Gagging Icy, cold towel on face or drink ice cold water   ZIO XT- Long Term Monitor Instructions  Your physician has requested you wear a ZIO patch monitor for 7 days.  This is a single patch monitor. [employer] supplies one patch monitor per enrollment. Additional stickers are not available. Please do not apply patch if you will be having a Nuclear Stress Test,  Echocardiogram, Cardiac CT, MRI, or Chest Xray during the period you would be wearing the  monitor. The patch cannot be worn during these tests. You cannot remove and re-apply the  ZIO XT patch monitor.  Your ZIO patch monitor will be  mailed 3 day USPS to your address on file. It may take 3-5 days  to receive your monitor after you have been enrolled.  Once you have received your monitor, please review the enclosed instructions. Your monitor  has already been registered assigning a specific monitor serial # to you.  Billing and Patient Assistance Program Information  We have supplied [employer] with any of your insurance information on file for billing purposes. [employer] offers a sliding scale Patient Assistance Program for patients that do not have  insurance, or whose insurance does not completely cover the cost of the ZIO monitor.  You must apply for the Patient Assistance Program to qualify for this discounted rate.  To apply, please call [employer] at [phone], select option 4, select option 2, ask to apply for  Patient Assistance Program. Meredeth will ask your household income, and how many people  are in your household. They will quote your out-of-pocket cost based on that information.  [employer] will also be able to set up a 74-month, interest-free payment plan if needed.  Applying the monitor   Shave hair from upper left chest.  Hold abrader disc by orange tab. Rub abrader in 40 strokes over the upper left chest as  indicated in your monitor instructions.  Clean area with 4 enclosed alcohol pads. Let dry.  Apply patch as indicated in monitor instructions. Patch will be placed under collarbone on left  side of chest with arrow pointing upward.  Rub patch adhesive wings for 2 minutes. Remove white label marked 1. Remove  the white  label marked 2. Rub patch adhesive wings for 2 additional minutes.  While looking in a mirror, press and release button in center of patch. A small green light will  flash 3-4 times. This will be your only indicator that the monitor has been turned on.  Do not shower for the first 24 hours. You may shower after the first 24 hours.  Press the button if you feel a symptom. You will hear  a small click. Record Date, Time and  Symptom in the Patient Logbook.  When you are ready to remove the patch, follow instructions on the last 2 pages of Patient  Logbook. Stick patch monitor onto the last page of Patient Logbook.  Place Patient Logbook in the blue and white box. Use locking tab on box and tape box closed  securely. The blue and white box has prepaid postage on it. Please place it in the mailbox as  soon as possible. Your physician should have your test results approximately 7 days after the  monitor has been mailed back to [facility].  Call [facility] Customer Care at [phone] if you have questions regarding  your ZIO XT patch monitor. Call them immediately if you see an orange light blinking on your  monitor.  If your monitor falls off in less than 4 days, contact our Monitor department at [phone].  If your monitor becomes loose or falls off after 4 days call [employer] at [phone] for  suggestions on securing your monitor           Postural Orthostatic Tachycardia Syndrome: Spent >20 minutes counseling specifically on the etiology, diagnosis, and symptom management of POTS. The following recommendations were emphasized:  -avoid dehydration. Often it requires high volumes of fluids, often with salt/electrolytes included, to stay hydrated. People with POTS are very sensitive to fluid shifts and dehydration. Oral rehydration is preferred, and routine use of IV fluids is not recommended.  -if tolerated, compression stocking can assist with fluid management and prevent pooling in the legs.  -slow position changes are recommended  -if there is a feeling of severe lightheadedness, like near to passing out, recommend lying on the floor on the back, with legs elevated up on a chair or up against the wall.  -the best long term management of POTS symptoms is gradual exercise conditioning. I recommend seated exercises such as bike to start, to avoid the risk  of falling with lightheadedness. Exercise programs, either through supervised programs like cardiac rehab or through personal programs, should focus on gradually increasing exercise tolerance and conditioning.   -this is a link to specific exercise recommendations for POTS:   Http://peterson-powell.net/  -we discussed the typical spectrum of dysautonomia, including typical populations, that this sometimes spontaneously improves with age (though a small percentage have persistent symptoms), that this has uncomfortable symptoms but is not associated with long term mortality, and that the etiology/treatment of this is an area of active research

## 2024-01-16 ENCOUNTER — Ambulatory Visit (INDEPENDENT_AMBULATORY_CARE_PROVIDER_SITE_OTHER): Admitting: Clinical

## 2024-01-16 DIAGNOSIS — F330 Major depressive disorder, recurrent, mild: Secondary | ICD-10-CM | POA: Diagnosis not present

## 2024-01-16 NOTE — Progress Notes (Signed)
" ° °  THERAPIST PROGRESS NOTE  Session Time: 30 min  Participation Level: Active  Behavioral Response: CasualAlertEuthymic  Type of Therapy: Individual Therapy  Treatment Goals addressed: client will engage in at least 80% of scheduled individual psychotherapy sessions  ProgressTowards Goals: Progressing  Interventions: CBT  Summary:  Tiffany [name] is a 24 y.o. female who presents for the scheduled appointment oriented times five, appropriately dressed and friendly. Client denied hallucinations and delusions. Client reported she has been doing good. Client reported she spent christmas and new years with her boyfriend and his family. Client reported it was a good busy 2 weeks. Client reported she got nice gifts from her boyfriend and his family. Client reported she has been a little sad because she is not with him since the holidays are over.  Client reported however she does feel like she is in a good mental and emotional space overall.  Client reported she still has some personalized gifts that she wants to make for her friends and mail to them. Client reported she will be getting a heart monitor to wear in the next few days. Client reported it is to determine her diagnosis of POTS. Client reported she has been trying to figure out how to accommodate her want of exercising. Client reported sometimes doing minimal things her heart rate will rise. Client reported she will take it one step at a time to see what the doctors see is going on. Evidence of progress towards goal:  client reported 1 positive of taking care of her physical health.  Suicidal/Homicidal: Nowithout intent/plan  Therapist Response:  Therapist began the appointment asking the client how she has been doing. Therapist engaged with active listening and positive emotional support. Therapist used cbt to ask her how she managed the holidays as well as positive and negative thoughts. Therapist used cbt to positively reinforce her  engaging in following up with her health care providers. Therapist used CBT ask the client to identify her progress with frequency of use with coping skills with continued practice in her daily activity.    Therapist assigned her homework to practice self care.   Plan: Return again in 3 weeks.  Diagnosis: mdd, recurrent, mild  Collaboration of Care: Patient refused AEB none requested.  Patient/Guardian was advised Release of Information must be obtained prior to any record release in order to collaborate their care with an outside provider. Patient/Guardian was advised if they have not already done so to contact the registration department to sign all necessary forms in order for us  to release information regarding their care.   Consent: Patient/Guardian gives verbal consent for treatment and assignment of benefits for services provided during this visit. Patient/Guardian expressed understanding and agreed to proceed.   Cyera Balboni Y Tinsley Everman, LCSW 01/16/2024  "

## 2024-01-18 ENCOUNTER — Encounter: Payer: Self-pay | Admitting: Cardiology

## 2024-01-18 NOTE — Assessment & Plan Note (Signed)
 Certainly, combination of PTSD and GAD can exacerbate tachycardia episodes.  Discussed the potential exacerbation of symptoms with prazosin  and decreasing her blood pressure, and Effexor . She has PRN hydroxyzine  as well as propranolol .

## 2024-01-18 NOTE — Assessment & Plan Note (Signed)
 Symptoms consistent with POTS, likely due to autonomic nervous system dysregulation. Differential includes SVT, but symptoms align more with POTS. - Encouraged hydration with water and electrolyte drinks like Gatorade Light or Liquid IV. - Advised sitting with feet elevated to improve venous return. - Recommended core and supine exercises to strengthen muscles and improve blood circulation. - Provided links to resources on POTS management and exercises. - Discussed potential use of support socks and abdominal binders.   Postural Orthostatic/Tachycardia Syndrome: I Spent >20 minutes counseling specifically on the etiology, diagnosis, and symptom management of POTS. The following recommendations were emphasized:  -avoid dehydration. Often it requires high volumes of fluids, often with salt/electrolytes included, to stay hydrated. People with POTS are very sensitive to fluid shifts and dehydration. Oral rehydration is preferred, and routine use of IV fluids is not recommended.  -if tolerated, compression stocking can assist with fluid management and prevent pooling in the legs.  -slow position changes are recommended  -if there is a feeling of severe lightheadedness, like near to passing out, recommend lying on the floor on the back, with legs elevated up on a chair or up against the wall.  -the best long term management of POTS symptoms is gradual exercise conditioning. I recommend seated exercises such as bike to start, to avoid the risk of falling with lightheadedness. Exercise programs, either through supervised programs like cardiac rehab or through personal programs, should focus on gradually increasing exercise tolerance and conditioning.   -this is a link to specific exercise recommendations for POTS:   Http://peterson-powell.net/  -we discussed the typical spectrum of dysautonomia, including typical populations, that  this sometimes spontaneously improves with age (though a small percentage have persistent symptoms), that this has uncomfortable symptoms but is not associated with long term mortality, and that the etiology/treatment of this is an area of active research
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
## 2024-02-01 ENCOUNTER — Other Ambulatory Visit: Payer: Self-pay

## 2024-02-01 ENCOUNTER — Encounter (HOSPITAL_COMMUNITY): Payer: Self-pay | Admitting: Emergency Medicine

## 2024-02-01 ENCOUNTER — Ambulatory Visit (INDEPENDENT_AMBULATORY_CARE_PROVIDER_SITE_OTHER): Admitting: Psychiatry

## 2024-02-01 ENCOUNTER — Emergency Department (HOSPITAL_COMMUNITY)
Admission: EM | Admit: 2024-02-01 | Discharge: 2024-02-02 | Disposition: A | Discharge status: Home / Self Care | Attending: Emergency Medicine | Admitting: Emergency Medicine

## 2024-02-01 DIAGNOSIS — Z91040 Latex allergy status: Secondary | ICD-10-CM | POA: Insufficient documentation

## 2024-02-01 DIAGNOSIS — Z7951 Long term (current) use of inhaled steroids: Secondary | ICD-10-CM | POA: Diagnosis not present

## 2024-02-01 DIAGNOSIS — F330 Major depressive disorder, recurrent, mild: Secondary | ICD-10-CM | POA: Diagnosis not present

## 2024-02-01 DIAGNOSIS — F411 Generalized anxiety disorder: Secondary | ICD-10-CM

## 2024-02-01 DIAGNOSIS — R42 Dizziness and giddiness: Secondary | ICD-10-CM | POA: Diagnosis not present

## 2024-02-01 DIAGNOSIS — F4310 Post-traumatic stress disorder, unspecified: Secondary | ICD-10-CM | POA: Diagnosis not present

## 2024-02-01 DIAGNOSIS — R0602 Shortness of breath: Secondary | ICD-10-CM | POA: Insufficient documentation

## 2024-02-01 DIAGNOSIS — R000 Tachycardia, unspecified: Secondary | ICD-10-CM | POA: Diagnosis present

## 2024-02-01 DIAGNOSIS — J45909 Unspecified asthma, uncomplicated: Secondary | ICD-10-CM | POA: Insufficient documentation

## 2024-02-01 DIAGNOSIS — R079 Chest pain, unspecified: Secondary | ICD-10-CM | POA: Diagnosis not present

## 2024-02-01 MED ORDER — VENLAFAXINE HCL ER 150 MG PO CP24: 150.0000 mg | ORAL_CAPSULE | Freq: Every day | ORAL | 0 refills | Status: AC

## 2024-02-01 NOTE — ED Triage Notes (Signed)
 Pt c/o two episodes of feeling like her heart was racing today. First episode was early this morning while shoveling ice, second episode was while lying in bed. Recently had a heart monitor, awaiting results. States that she has not taken her anxiety meds today.

## 2024-02-02 ENCOUNTER — Emergency Department (HOSPITAL_COMMUNITY)

## 2024-02-02 LAB — BASIC METABOLIC PANEL WITH GFR
Anion gap: 10 (ref 5–15)
BUN: 15 mg/dL (ref 6–20)
CO2: 22 mmol/L (ref 22–32)
Calcium: 9.1 mg/dL (ref 8.9–10.3)
Chloride: 105 mmol/L (ref 98–111)
Creatinine, Ser: 0.79 mg/dL (ref 0.44–1.00)
GFR, Estimated: >60 mL/min
Glucose, Bld: 99 mg/dL (ref 70–99)
Potassium: 3.8 mmol/L (ref 3.5–5.1)
Sodium: 137 mmol/L (ref 135–145)

## 2024-02-02 LAB — TROPONIN T, HIGH SENSITIVITY: Troponin T High Sensitivity: <6 ng/L (ref 0–19)

## 2024-02-02 LAB — CBC
HCT: 39.7 % (ref 36.0–46.0)
Hemoglobin: 13.5 g/dL (ref 12.0–15.0)
MCH: 30.7 pg (ref 26.0–34.0)
MCHC: 34 g/dL (ref 30.0–36.0)
MCV: 90.2 fL (ref 80.0–100.0)
Platelets: 251 10*3/uL (ref 150–400)
RBC: 4.4 MIL/uL (ref 3.87–5.11)
RDW: 12.1 % (ref 11.5–15.5)
WBC: 8.5 10*3/uL (ref 4.0–10.5)
nRBC: 0 % (ref 0.0–0.2)

## 2024-02-02 LAB — D-DIMER, QUANTITATIVE: D-Dimer, Quant: <0.27 ug{FEU}/mL (ref 0.00–0.50)

## 2024-02-02 LAB — HCG, SERUM, QUALITATIVE: Preg, Serum: NEGATIVE

## 2024-02-02 NOTE — Discharge Instructions (Signed)
 As we discussed, your workup here is largely reassuring.  Please follow-up outpatient with your cardiologist.  Please continue taking all medications as prescribed.  You asked about taking metoprolol in the morning and I have advised you that you should follow cardiology recommendations.  Please follow-up outpatient with your cardiologist.  Please return to the ED with new symptoms.  Continue hydrating yourself at home as directed by cardiology.

## 2024-02-02 NOTE — ED Provider Notes (Signed)
 " [place] EMERGENCY DEPARTMENT AT [facility] Provider Note   CSN: [id number] Arrival date & time: 02/01/24  2159     Patient presents with: Tachycardia   Tiffany [name] is a 24 y.o. female with history of paroxysmal tachycardia, ADHD, chronic asthma, GAD, PTSD, MDD, POTS.  Presents to ED complaining of tachycardia, chest pain and shortness of breath.  Patient reports that she was shoveling ice in her driveway around 430 or 5 when she became diaphoretic, lightheaded and began to feel as if her heart was racing.  She reports she went inside and laid down where the symptoms continued for about 30 minutes and then resolved.  She reports that her chest pain is located centrally without radiation.  Reports after 30 minutes her chest pain and shortness of breath resolved.  Reports that she was lying down, sat up to go eat and then after eating came back and returned to lying down.  States that upon lying down she had returned of chest pain, shortness of breath and a feeling as if her bilateral feet were cold.  States she also got lightheaded at this time.  Reports this lasted about 10 minutes and resolved.  Denies any recurrence of these events since the second episode this evening.  Arrives to the ED complaining of tachycardia.  She denies any current chest pain.  Denies any nausea or vomiting.  Denies lightheadedness, dizziness, weakness or leg swelling.  Reports she is currently being followed by cardiology and had recent Zio patch, she is unsure of the results.  Chart reviewed and the patient has been diagnosed with POTS that seems, also dysautonomia.  HPI     Prior to Admission medications  Medication Sig Start Date End Date Taking? Authorizing Provider  famotidine  (PEPCID ) 20 MG tablet Take 1 tablet (20 mg total) by mouth 2 (two) times daily. 10/19/23   Alvia Corean CROME, FNP  fluticasone  (FLONASE ) 50 MCG/ACT nasal spray Place 2 sprays into both nostrils daily. 10/19/23    Alvia Corean CROME, FNP  levothyroxine  (SYNTHROID ) 25 MCG tablet TAKE 1 TABLET(25 MCG) BY MOUTH DAILY 10/20/23   Alvia Corean CROME, FNP  prazosin  (MINIPRESS ) 1 MG capsule Take 1 capsule (1 mg total) by mouth at bedtime. 12/07/23 03/06/24  Chien, Stephanie, MD  propranolol  (INDERAL ) 10 MG tablet Take 1 tablet (10 mg total) by mouth daily as needed (anxiety). 12/07/23 03/06/24  Chien, Stephanie, MD  venlafaxine  XR (EFFEXOR  XR) 150 MG 24 hr capsule Take 1 capsule (150 mg total) by mouth daily with breakfast. 02/01/24 05/01/24  Graham Corean, MD  VENTOLIN  HFA 108 (90 Base) MCG/ACT inhaler INHALE 1 TO 2 PUFFS INTO THE LUNGS EVERY 6 HOURS AS NEEDED FOR WHEEZING OR SHORTNESS OF BREATH 10/05/23   Alvia Corean CROME, FNP    Allergies: Latex    Review of Systems  Respiratory:  Positive for shortness of breath.   Cardiovascular:  Positive for chest pain.  Neurological:  Positive for light-headedness.  All other systems reviewed and are negative.   Updated Vital Signs BP 123/77   Pulse 83   Temp 98.3 F (36.8 C)   Resp 18   SpO2 99%   Physical Exam Vitals and nursing note reviewed.  Constitutional:      General: She is not in acute distress.    Appearance: She is well-developed.  HENT:     Head: Normocephalic and atraumatic.  Eyes:     Conjunctiva/sclera: Conjunctivae normal.  Cardiovascular:     Rate  and Rhythm: Normal rate and regular rhythm.     Heart sounds: No murmur heard. Pulmonary:     Effort: Pulmonary effort is normal. No respiratory distress.     Breath sounds: Normal breath sounds.  Abdominal:     Palpations: Abdomen is soft.     Tenderness: There is no abdominal tenderness.  Musculoskeletal:        General: No swelling.     Cervical back: Neck supple.  Skin:    General: Skin is warm and dry.     Capillary Refill: Capillary refill takes less than 2 seconds.  Neurological:     Mental Status: She is alert and oriented to person, place, and time. Mental status is at  baseline.  Psychiatric:        Mood and Affect: Mood normal.     (all labs ordered are listed, but only abnormal results are displayed) Labs Reviewed  CBC  BASIC METABOLIC PANEL WITH GFR  D-DIMER, QUANTITATIVE  HCG, SERUM, QUALITATIVE  TROPONIN T, HIGH SENSITIVITY    EKG: None  Radiology: DG Chest Portable 1 View Result Date: 02/02/2024 EXAM: 1 VIEW(S) XRAY OF THE CHEST 02/02/2024 01:47:00 AM COMPARISON: None available. CLINICAL HISTORY: Chest pain. FINDINGS: LUNGS AND PLEURA: Low lung volumes. No focal pulmonary opacity. No pleural effusion. No pneumothorax. HEART AND MEDIASTINUM: No acute abnormality of the cardiac and mediastinal silhouettes. BONES AND SOFT TISSUES: No acute osseous abnormality. IMPRESSION: 1. No acute findings. 2. Low lung volumes. Electronically signed by: Morgane Naveau MD 02/02/2024 01:49 AM EST RP Workstation: [id number]    Procedures   Medications Ordered in the ED - No data to display     Medical Decision Making Amount and/or Complexity of Data Reviewed Labs: ordered. Radiology: ordered. ECG/medicine tests: ordered.   This is a 24 year old female presenting to the ED due to concerns of chest pain, shortness of breath.  Patient currently being seen by cardiology, there is concern the patient could have POTS as well as dysautonomia which would certainly explain patient presentation this evening.  On exam she is HD stable.  She was initially tachycardic on arrival however now her pulse rate is normalized without intervention.  Her abdomen is soft and compressible.  Her neurological examination is at baseline.  Her lungs are clear to auscultation bilaterally.  Overall she is nontoxic in appearance in no apparent distress sitting upright in the bed talking in full sentences.  Will assess with CBC, BMP, troponin, dimer due to tachycardia, hCG, chest x-ray and EKG.  CBC without leukocytosis or anemia.  Metabolic panel is grossly unremarkable.  D-dimer  negative.  Troponin undetectable.  hCG negative.  Chest x-ray unremarkable, EKG is nonischemic.  Patient reevaluated after workup and she reports that she has no chest pain or shortness of breath at this time.  Patient work up appears reassuring.  Patient was advised to follow-up outpatient with her cardiology service and she voiced understanding.  Patient did report to me that she was advised by her cardiologist to start taking metoprolol in the daytime.  She reports that she take this at night for her anxiety but was advised recently for her to take this in the daytime as instructed by her cardiologist.  I instructed the patient to follow cardiology recommendations and follow-up outpatient and she voiced understanding.  Stable to discharge.    Final diagnoses:  Chest pain, unspecified type  Tachycardia    ED Discharge Orders     None  Ruthell Lonni FALCON, PA-C 02/02/24 ARTEMUS Jerral Meth, MD 02/02/24 0500  "

## 2024-02-09 ENCOUNTER — Encounter: Payer: Self-pay | Admitting: Cardiology

## 2024-02-13 ENCOUNTER — Ambulatory Visit (HOSPITAL_COMMUNITY): Admitting: Clinical

## 2024-02-27 ENCOUNTER — Ambulatory Visit (HOSPITAL_COMMUNITY): Admitting: Clinical

## 2024-04-11 ENCOUNTER — Encounter (HOSPITAL_COMMUNITY): Admitting: Psychiatry

## 2024-05-14 ENCOUNTER — Ambulatory Visit: Admitting: Cardiology

## 2024-06-12 ENCOUNTER — Ambulatory Visit: Admitting: Allergy
